# Patient Record
Sex: Female | Born: 1970 | Race: White | Hispanic: No | Smoking: Never smoker
Health system: Southern US, Community
[De-identification: ages and names within clinical notes are randomized; demographics above are authoritative.]

## PROBLEM LIST (undated history)

## (undated) DIAGNOSIS — R519 Headache, unspecified: Secondary | ICD-10-CM

## (undated) DIAGNOSIS — F419 Anxiety disorder, unspecified: Secondary | ICD-10-CM

## (undated) DIAGNOSIS — R51 Headache: Secondary | ICD-10-CM

## (undated) HISTORY — DX: Headache, unspecified: R51.9

## (undated) HISTORY — DX: Anxiety disorder, unspecified: F41.9

## (undated) HISTORY — PX: BUNIONECTOMY: SHX129

## (undated) HISTORY — DX: Headache: R51

## (undated) HISTORY — PX: ELBOW FRACTURE SURGERY: SHX616

## (undated) HISTORY — PX: WISDOM TOOTH EXTRACTION: SHX21

---

## 2008-12-22 LAB — CONVERTED CEMR LAB: Pap Smear: NORMAL

## 2009-03-06 ENCOUNTER — Ambulatory Visit: Payer: Self-pay | Admitting: Family Medicine

## 2009-03-06 DIAGNOSIS — J069 Acute upper respiratory infection, unspecified: Secondary | ICD-10-CM | POA: Insufficient documentation

## 2009-04-21 ENCOUNTER — Ambulatory Visit: Payer: Self-pay | Admitting: Family Medicine

## 2009-05-25 ENCOUNTER — Ambulatory Visit: Payer: Self-pay | Admitting: Family Medicine

## 2009-05-25 ENCOUNTER — Encounter: Admission: RE | Admit: 2009-05-25 | Discharge: 2009-05-25 | Payer: Self-pay | Admitting: Family Medicine

## 2009-05-25 DIAGNOSIS — M79609 Pain in unspecified limb: Secondary | ICD-10-CM

## 2009-05-25 DIAGNOSIS — R5381 Other malaise: Secondary | ICD-10-CM

## 2009-05-25 DIAGNOSIS — R5383 Other fatigue: Secondary | ICD-10-CM

## 2009-05-25 IMAGING — CR DG TIBIA/FIBULA 2V*L*
2 series · 2 of 2 positions shown · non-contrast
Comparison: None.

CLINICAL DATA: Left leg pain and tenderness.

LEFT TIBIA AND FIBULA - 2 VIEW

[view not recorded (1 of 2)]
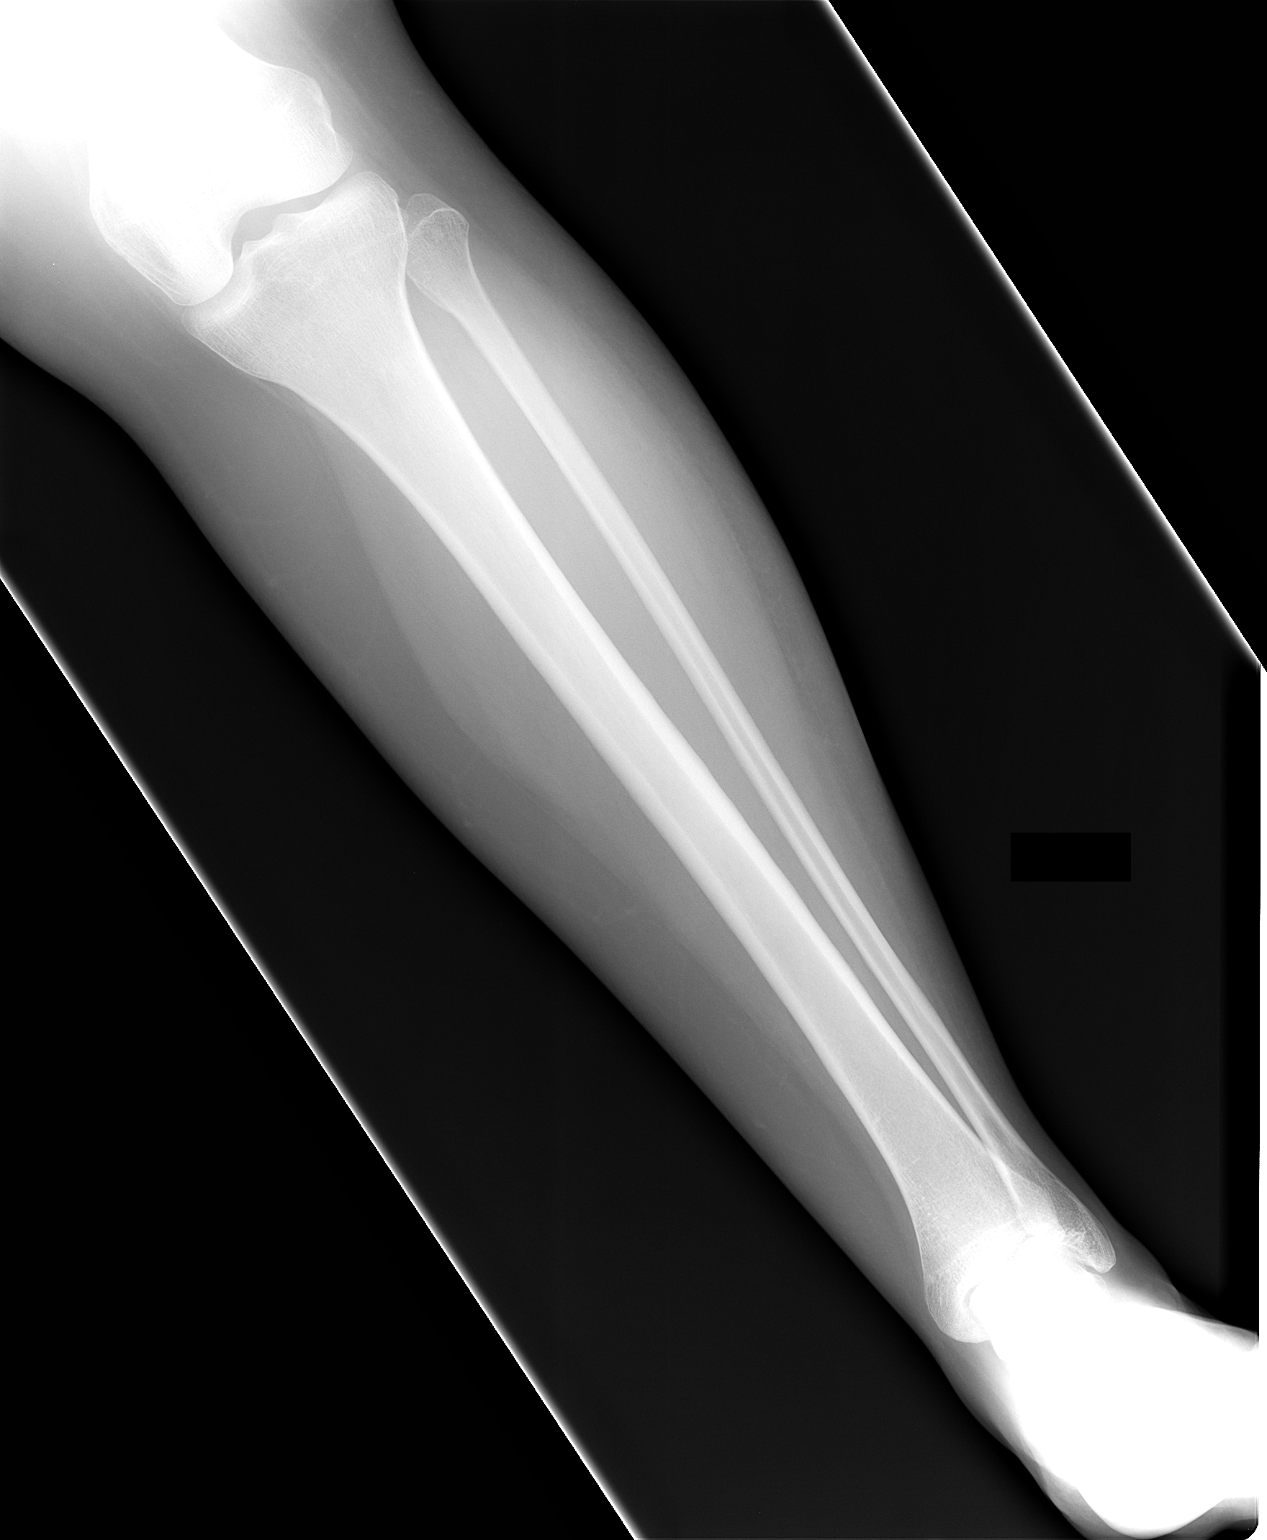

[view not recorded (2 of 2)]
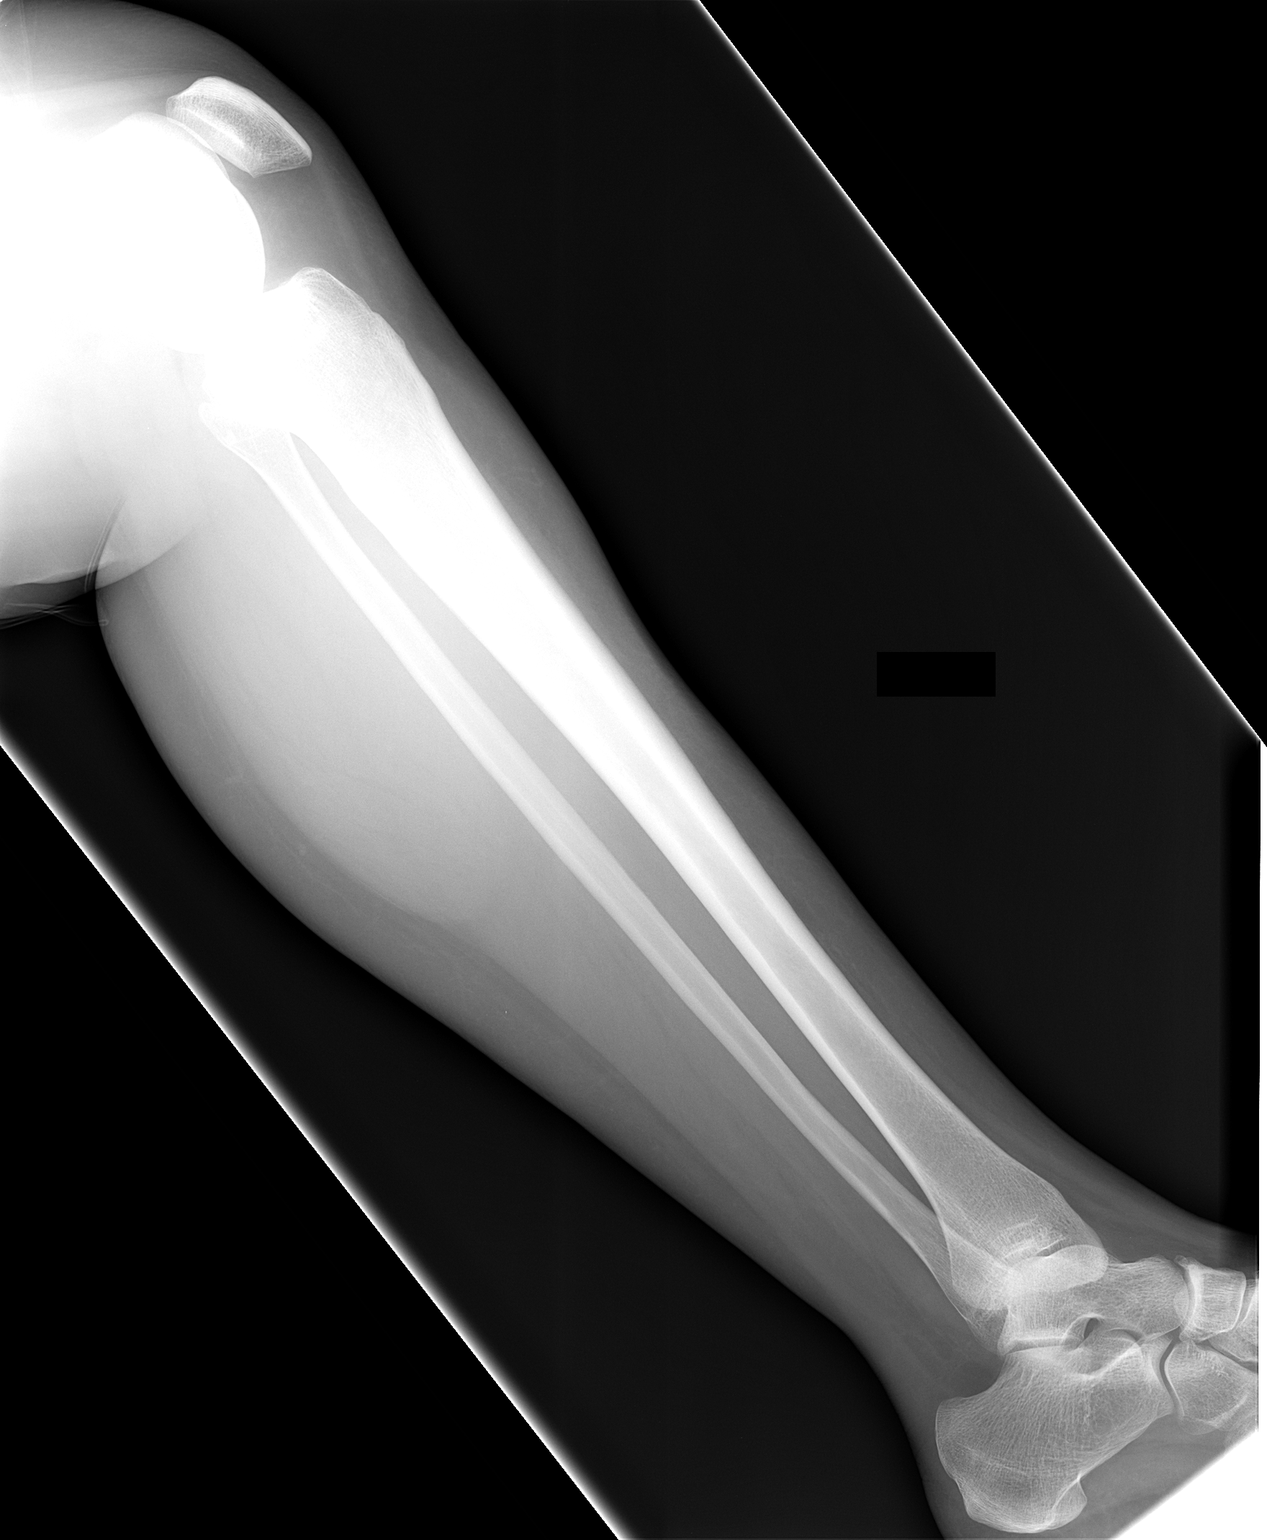

[2 of 2 positions shown; findings below may reference images not displayed]

FINDINGS: There is no evidence of fracture or other focal bone
lesions.  Soft tissues are unremarkable.
IMPRESSION: Negative.

## 2009-05-26 LAB — CONVERTED CEMR LAB
ALT: 10 units/L (ref 0–35)
AST: 12 units/L (ref 0–37)
Albumin: 4.2 g/dL (ref 3.5–5.2)
Alkaline Phosphatase: 63 units/L (ref 39–117)
BUN: 13 mg/dL (ref 6–23)
Calcium: 8.9 mg/dL (ref 8.4–10.5)
Cholesterol: 136 mg/dL (ref 0–200)
HDL: 67 mg/dL (ref >39)
Total CHOL/HDL Ratio: 2
Total Protein: 7.3 g/dL (ref 6.0–8.3)
Triglycerides: 81 mg/dL (ref <150)

## 2009-09-13 ENCOUNTER — Ambulatory Visit: Payer: Self-pay | Admitting: Family Medicine

## 2009-09-13 DIAGNOSIS — E559 Vitamin D deficiency, unspecified: Secondary | ICD-10-CM | POA: Insufficient documentation

## 2009-09-13 DIAGNOSIS — T148 Other injury of unspecified body region: Secondary | ICD-10-CM | POA: Insufficient documentation

## 2009-09-13 DIAGNOSIS — W57XXXA Bitten or stung by nonvenomous insect and other nonvenomous arthropods, initial encounter: Secondary | ICD-10-CM

## 2009-09-14 ENCOUNTER — Encounter: Payer: Self-pay | Admitting: Family Medicine

## 2010-05-23 NOTE — Assessment & Plan Note (Signed)
Summary: bug bites   Vital Signs:  Patient profile:   40 year old female Height:      62.2 inches Weight:      178 pounds BMI:     32.46 O2 Sat:      100 % on Room air Pulse rate:   70 / minute BP sitting:   123 / 79  (left arm) Cuff size:   regular  Vitals Entered By: Payton Spark CMA (Sep 13, 2009 3:23 PM)  O2 Flow:  Room air CC: Was in [place] over the weekend, got multiple insect bites on lower legs and lower legs/ankles swollen.    Primary Care Provider:  Nani Gasser MD  CC:  Was in [place] over the weekend and got multiple insect bites on lower legs and lower legs/ankles swollen. Rebecca Shea  History of Present Illness: 40 yo WF presents for bug bites on her legs first noticed on Sat night while at [place] (3 days ago).  She had ankle edema on Sunday.  She has been trying to keep feet elevated.  Using benadryl and epsom salt soaks.  Has a little voice hoarsenss and a dry cough today but no problems swallowing.    She is due to recheck her Vit D level today.  Has itching.  No fevers.  No pain other than from foot swelling.  Current Medications (verified): 1)  One-A-Day Extras Antioxidant  Caps (Multiple Vitamins-Minerals)  Allergies (verified): 1)  ! Sulfa  Past History:  Past Medical History: Reviewed history from 05/25/2009 and no changes required. Unremarkable Broken arm 6th grade.   Social History: Reviewed history from 05/25/2009 and no changes required. 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  Married to [place].  No kids. Non-smoker ETOH-yes No drugs Office work 2 caffeinated drinks per day.   Review of Systems      See HPI  Physical Exam  General:  alert, well-developed, well-nourished, and well-hydrated.   Head:  normocephalic and atraumatic.   Eyes:  conjunctiva clear Nose:  no nasal congestion Mouth:  o/p slightly injected Neck:  no masses.  no trouble swallowing Lungs:  Normal respiratory effort, chest expands symmetrically. Lungs  are clear to auscultation, no crackles or wheezes. Heart:  Normal rate and regular rhythm. S1 and S2 normal without gallop, murmur, click, rub or other extra sounds. Skin:  localized hyperemia around insect bites on LEs only.  No purulence.  1+ pitting soft tissue edema over the distal 1/3 of both LEs/ ankles and feet.   Cervical Nodes:  No lymphadenopathy noted Psych:  good eye contact, not anxious appearing, and not depressed appearing.     Impression & Recommendations:  Problem # 1:  INSECT BITE (ICD-919.4) Localized hypersensitivity reaction with soft tissue edema bilat LEs from recent insect bites w/o sign of infection. Treat with 5 days of Prednisone 40 mg/ day.  Use Benadryl at night.  Use cool compresses.  Wash w/ soap and water and cover with caladyrl to help dry out.  Elevate legs.  Call if not improved in 5 days.  Problem # 2:  UNSPECIFIED VITAMIN D DEFICIENCY (ICD-268.9) Rcheck level today. Orders: T-Vitamin D (25-Hydroxy) [id number])  Complete Medication List: 1)  One-a-day Extras Antioxidant Caps (Multiple vitamins-minerals) 2)  Prednisone 20 Mg Tabs (Prednisone) .... 2 tab by mouth q am x 5 days  Patient Instructions: 1)  Vitamin D level today. 2)  Will call you w/ results tomorrow. 3)  Start on Prednisone.  Take for 5 days. 4)  Use Tylenol PM if needed for sleep while on the Prednisone. 5)  Elevate legs, uses ice pack and topical Caladyrl cream.   6)  Call if leg swelling not improved in 5 days. Prescriptions: PREDNISONE 20 MG TABS (PREDNISONE) 2 tab by mouth q AM x 5 days  #10 x 0   Entered and Authorized by:   Seymour Bars DO   Signed by:   Seymour Bars DO on 09/13/2009   Method used:   Electronically to        [facility] [phone]* (retail)       [address]       [place], [place]  [address]       Ph: [phone] or [phone]       Fax: [phone]   RxID:   [id number]

## 2010-05-23 NOTE — Assessment & Plan Note (Signed)
Summary: NOV: URI, fatigue, knot on leg   Vital Signs:  Patient profile:   40 year old female Height:      62.2 inches Weight:      170 pounds BMI:     31.01 O2 Sat:      99 % Pulse rate:   73 / minute BP sitting:   110 / 70  (left arm) Cuff size:   regular  Vitals Entered By: Kathlene November (May 25, 2009 8:35 AM) CC: NP- get established Is Patient Diabetic? No   CC:  NP- get established.  History of Present Illness: iN Nov has a bad respiratory infection and strep. Was treated.  Around [place] Years had similar sxs adn was treated with ABX again. Has had 2 rounds of amoxicillin.  Last week started having sxs again.  Having some nasal drainage and congestion. Started some claritin about 3 days ago.  Still noticing some wheeze.  Feels fatigued.  NO ST this time.  No hx of asthma.  Used some nose spray, affirn last night.  Has noticed inc SOB with activity but is mild.    Also c/o tenderness in shins and forearms  and unable to get massages in that area since so sensitive. She also has s knot on the right out lower leg that has been there for almost a year. Initally was red but not not able to see if by looking at the skin.  It is tender to touch.    Habits & Providers  Alcohol-Tobacco-Diet     Alcohol drinks/day: <1     Tobacco Status: never  Exercise-Depression-Behavior     Does Patient Exercise: no     STD Risk: never     Drug Use: never     Seat Belt Use: always  Current Medications (verified): 1)  One-A-Day Extras Antioxidant  Caps (Multiple Vitamins-Minerals)  Allergies (verified): 1)  ! Sulfa  Comments:  Nurse/Medical Assistant: The patient's medications and allergies were reviewed with the patient and were updated in the Medication and Allergy Lists. Kathlene November (May 25, 2009 8:36 AM)  Past History:  Past Medical History: Unremarkable Broken arm 6th grade.   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  No kids. Non-smoker ETOH-yes No drugs Office work 2 caffeinated drinks per day. Smoking Status:  never Does Patient Exercise:  no STD Risk:  never Drug Use:  never Seat Belt Use:  always  Review of Systems       No fever/sweats/weakness, unexplained weight loss/gain.  No vison changes.  No difficulty hearing/ringing in ears, hay fever/allergies.  No chest pain/discomfort, palpitations.  No Br lump/nipple discharge.  + cough/wheeze.  No blood in BM, nausea/vomiting/diarrhea.  No nighttime urination, leaking urine, unusual vaginal bleeding, discharge (penis or vagina).  No muscle/joint pain. No rash, change in mole.  No HA, memory loss.  No anxiety, sleep d/o, depression.  No easy bruising/bleeding, + unexplained lump   Physical Exam  General:  Well-developed,well-nourished,in no acute distress; alert,appropriate and cooperative throughout examination Head:  Normocephalic and atraumatic without obvious abnormalities. No apparent alopecia or balding. Eyes:  No corneal or conjunctival inflammation noted. EOMI. Perrla. Ears:  External ear exam shows no significant lesions or deformities.  Otoscopic examination reveals clear canals, tympanic membranes are intact bilaterally without bulging, retraction,  inflammation or discharge. Hearing is grossly normal bilaterally. Nose:  External nasal examination shows no deformity or inflammation. Nasal mucosa are pink and moist without lesions or exudates. Mouth:  Tonsils are enlarged bilat with mild erythema.  No lesions.  Neck:  No deformities, masses, or tenderness noted. Lungs:  Normal respiratory effort, chest expands symmetrically. Lungs are clear to auscultation, no crackles or wheezes. Heart:  Normal rate and regular rhythm. S1 and S2 normal without gallop, murmur, click, rub or other extra sounds. Msk:   Right lower leg abourt 4 inches above her lateral malleolus has a slightly firm noduel that is tender but no skin changes.  Extremities:  NO LE edmea.  Skin:  no rashes.   Cervical Nodes:  no anterior cervical adenopathy.   Psych:  Cognition and judgment appear intact. Alert and cooperative with normal attention span and concentration. No apparent delusions, illusions, hallucinations   Impression & Recommendations:  Problem # 1:  URI (ICD-465.9) Discussed that it is difficulty to say that these may be 3 totally separate infections since did have a month betweeen each one. She is having excessive fatigue wtih this one so will check for mono. Tonsils are enlarged but she doesn't really have much of a ST. Mono was neg. Likely viral. If not better in 1 week then please call and will treat.   The following medications were removed from the medication list:    Mucinex D 60-600 Mg Xr12h-tab (Pseudoephedrine-guaifenesin) .Marland Kitchen... As directed  Problem # 2:  FATIGUE (ICD-780.79) Will also rule out thyroid d/o and vitamin D def since has alot of tenderness in her shins and forearms.  Orders: T-Vitamin D (25-Hydroxy) [phone]) T-TSH [phone]) T-Comprehensive Metabolic Panel ([phone])  Problem # 3:  SCREENING FOR LIPOID DISORDERS (ICD-V77.91) She would also liek to have a cholesteorl screen. She is fasting today.  Orders: T-Lipid Profile ([id number])  Problem # 4:  LEG PAIN, RIGHT (ICD-729.5)  Will get xray of right lower  tibia and fibula to rule out lesion on teh bone or calcified traumatic lesion.   Orders: T-*Unlisted Diagnostic X-ray test/procedure [id number])  Complete Medication List: 1)  One-a-day Extras Antioxidant Caps (Multiple vitamins-minerals)  TD Result Date:  04/23/2004 TD Result:  given TD Next Due:  10 yr PAP Result Date:  12/22/2008 PAP Result:  normal

## 2011-10-30 ENCOUNTER — Encounter: Payer: Self-pay | Admitting: Physician Assistant

## 2011-10-30 ENCOUNTER — Ambulatory Visit (INDEPENDENT_AMBULATORY_CARE_PROVIDER_SITE_OTHER): Payer: BC Managed Care – PPO

## 2011-10-30 ENCOUNTER — Ambulatory Visit (INDEPENDENT_AMBULATORY_CARE_PROVIDER_SITE_OTHER): Payer: BC Managed Care – PPO | Admitting: Physician Assistant

## 2011-10-30 ENCOUNTER — Ambulatory Visit: Payer: Self-pay | Admitting: Physician Assistant

## 2011-10-30 VITALS — BP 111/72 | HR 69 | Temp 98.3°F | Ht 62.0 in | Wt 162.0 lb

## 2011-10-30 DIAGNOSIS — S299XXA Unspecified injury of thorax, initial encounter: Secondary | ICD-10-CM

## 2011-10-30 DIAGNOSIS — F411 Generalized anxiety disorder: Secondary | ICD-10-CM

## 2011-10-30 DIAGNOSIS — S298XXA Other specified injuries of thorax, initial encounter: Secondary | ICD-10-CM

## 2011-10-30 DIAGNOSIS — Z0000 Encounter for general adult medical examination without abnormal findings: Secondary | ICD-10-CM

## 2011-10-30 DIAGNOSIS — X58XXXA Exposure to other specified factors, initial encounter: Secondary | ICD-10-CM

## 2011-10-30 DIAGNOSIS — R0789 Other chest pain: Secondary | ICD-10-CM

## 2011-10-30 DIAGNOSIS — E559 Vitamin D deficiency, unspecified: Secondary | ICD-10-CM

## 2011-10-30 DIAGNOSIS — Z131 Encounter for screening for diabetes mellitus: Secondary | ICD-10-CM

## 2011-10-30 DIAGNOSIS — R079 Chest pain, unspecified: Secondary | ICD-10-CM

## 2011-10-30 DIAGNOSIS — F419 Anxiety disorder, unspecified: Secondary | ICD-10-CM

## 2011-10-30 DIAGNOSIS — F329 Major depressive disorder, single episode, unspecified: Secondary | ICD-10-CM

## 2011-10-30 DIAGNOSIS — Z13220 Encounter for screening for lipoid disorders: Secondary | ICD-10-CM

## 2011-10-30 IMAGING — CR DG CHEST 2V
2 series · 2 of 2 positions shown · non-contrast
Comparison: None.

CLINICAL DATA: Chest trauma 10 days ago, persistent pain

CHEST - 2 VIEW

[view not recorded (1 of 2)]
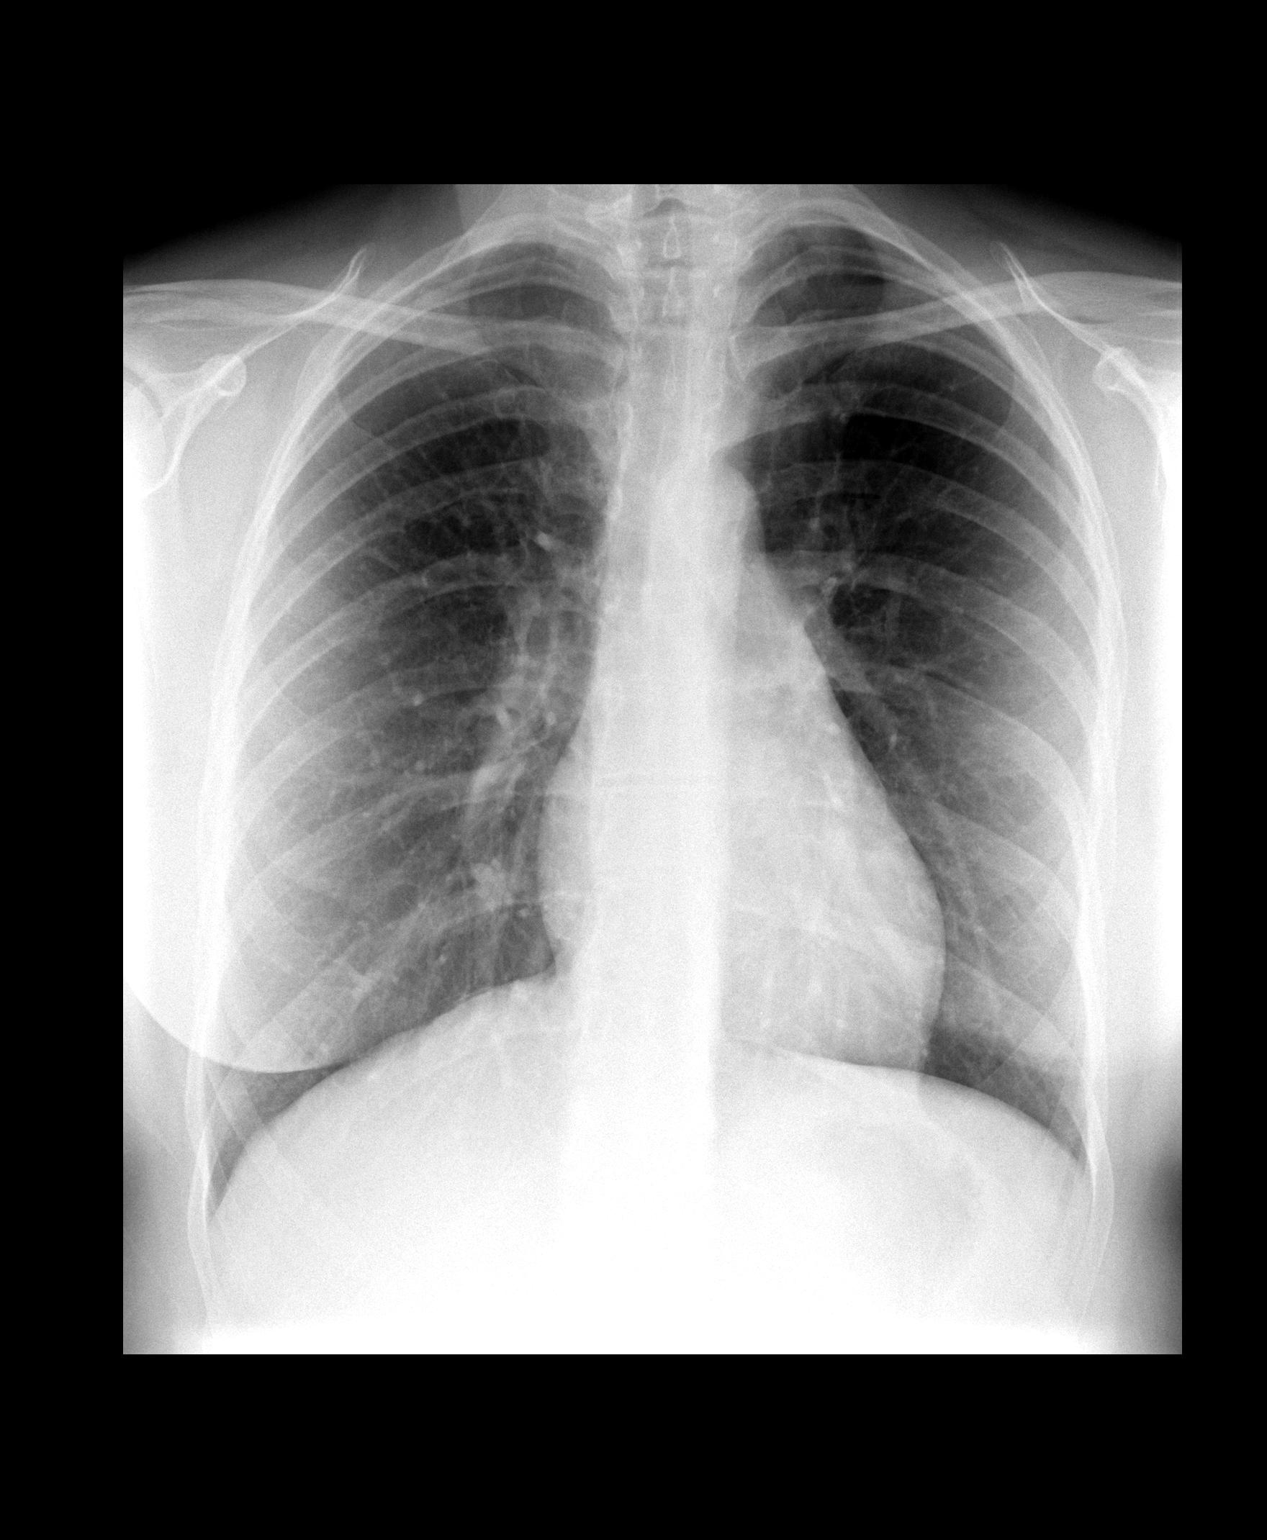

[view not recorded (2 of 2)]
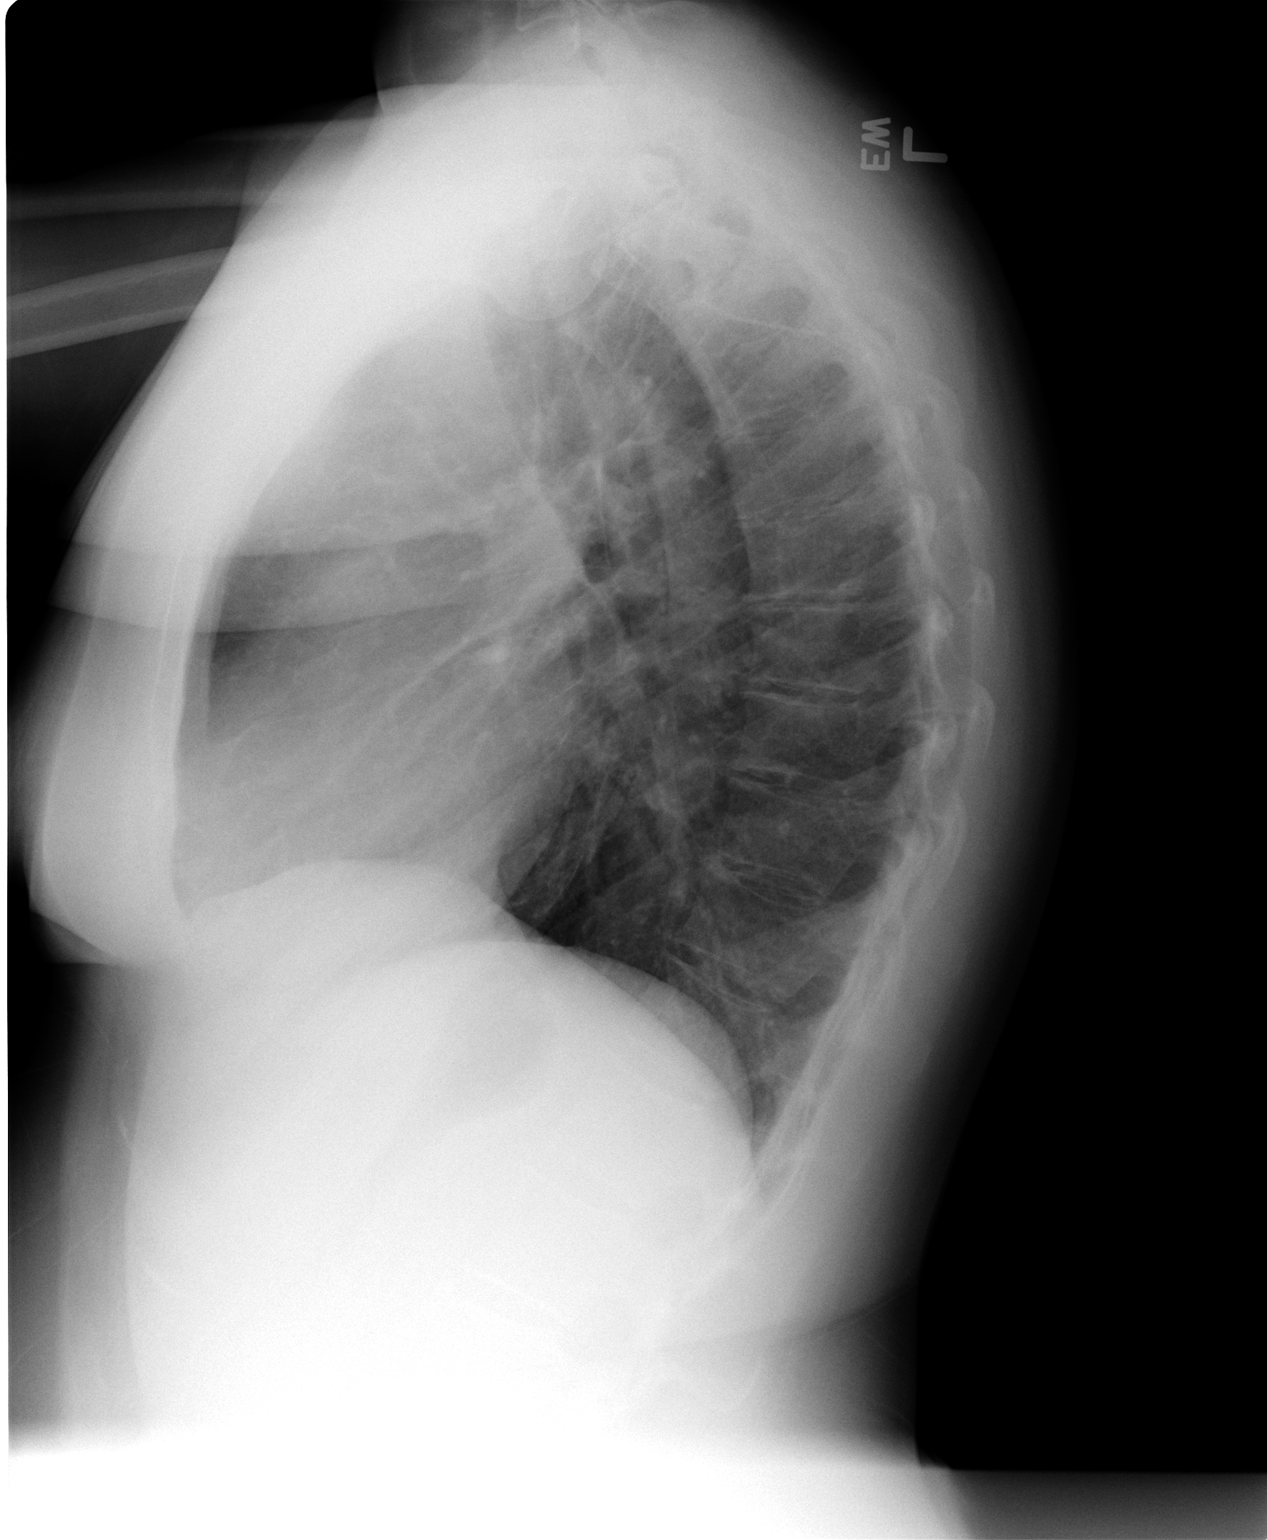

[2 of 2 positions shown; findings below may reference images not displayed]

FINDINGS: The lungs are clear.  No pneumothorax is seen.
Mediastinal contours appear normal.  The heart is within normal
limits in size.  No bony abnormality is seen.
IMPRESSION: No active lung disease.

## 2011-10-30 MED ORDER — CLONAZEPAM 0.5 MG PO TABS: 0.5000 mg | ORAL_TABLET | Freq: Two times a day (BID) | ORAL | Status: DC | PRN

## 2011-10-30 MED ORDER — SERTRALINE HCL 50 MG PO TABS: ORAL_TABLET | ORAL | Status: DC

## 2011-10-30 NOTE — Progress Notes (Addendum)
  Subjective:    Patient ID: Rebecca Shea, female    DOB: 12-02-1970, 41 y.o.   MRN: [id number]  HPI 10 days ago a friend tried to pop her back in place and since her ribs have hurt and she has had chest tightness. Pain is worse when she takes a deep breath in. She did also go ziplining on Sunday. She denies and chest pain but there is a lot of tightness and strain. Denies any numbness or tingling down arms, jaw pain, or radiating pain.  Walking also makes worse. NO SOB or palpations. No swelling. She also talks about feeling very overwhelmed and anxious. Her marriage is not doing well and then she feels more tired than usual. She has some bouts of feeling down but more just worried about the furture. She is trying to make time to talk to someone to see if she can work on her marriage. She denies any thoughts of suicide. Not ever tried anything for depression or anxiety.     Review of Systems     Objective:   Physical Exam  Constitutional: She is oriented to person, place, and time. She appears well-developed and well-nourished.  HENT:  Head: Normocephalic and atraumatic.  Eyes: Conjunctivae are normal.  Neck: Normal range of motion. Neck supple. No thyromegaly present.  Cardiovascular: Normal rate, regular rhythm and normal heart sounds.   Pulmonary/Chest: Effort normal and breath sounds normal. She has no wheezes.  Musculoskeletal:       Tenderness with palpation over anterior bottom left and right ribs.   Neurological: She is alert and oriented to person, place, and time.  Skin: Skin is warm and dry.  Psychiatric: Her behavior is normal.       Seemed overwhelmed and cried talking about her marriage.           Assessment & Plan:  Chest trauma/rib pain- EKG- NSR, No acute ST changes, NOrmal axis.Will get chest xray and call with results. For soreness and pain take Ibuprofen 800mg  up to three times a day. If not continuing to improve call office.   Anxiety-  Start Zoloft today 1/2  tab for 7 days then increase to 1 tab. Only take klonapin as needed. Discussed side effects of both medications. If depression worsens please call office and stop medication.   . Schedule CPE and f/u for same visit in 4-6 weeks. Gave lab slip to get bloodwork.

## 2011-10-30 NOTE — Patient Instructions (Addendum)
Will call with chest x-ray. Will call with lab results. Schedule CPE and f/u for same visit in 4-6 weeks. Start Zoloft today 1/2 tab for 7 days then increase to 1 tab. Only take klonapin as needed.   Take ibuprofen 800mg  up to three times a day for left rib pain.

## 2011-10-31 LAB — LIPID PANEL
Cholesterol: 154 mg/dL (ref 0–200)
HDL: 77 mg/dL (ref >39)
LDL Cholesterol: 55 mg/dL (ref 0–99)
Triglycerides: 112 mg/dL (ref <150)
VLDL: 22 mg/dL (ref 0–40)

## 2011-10-31 LAB — COMPREHENSIVE METABOLIC PANEL
ALT: 9 U/L (ref 0–35)
Albumin: 4.2 g/dL (ref 3.5–5.2)
Calcium: 9.2 mg/dL (ref 8.4–10.5)
Total Protein: 6.8 g/dL (ref 6.0–8.3)

## 2011-10-31 LAB — VITAMIN D 25 HYDROXY (VIT D DEFICIENCY, FRACTURES): Vit D, 25-Hydroxy: 31 ng/mL (ref 30–89)

## 2011-11-06 ENCOUNTER — Encounter: Payer: Self-pay | Admitting: *Deleted

## 2011-12-26 ENCOUNTER — Encounter: Payer: Self-pay | Admitting: Physician Assistant

## 2011-12-26 ENCOUNTER — Ambulatory Visit (INDEPENDENT_AMBULATORY_CARE_PROVIDER_SITE_OTHER): Payer: BC Managed Care – PPO | Admitting: Physician Assistant

## 2011-12-26 VITALS — BP 108/76 | HR 75 | Ht 62.0 in | Wt 159.0 lb

## 2011-12-26 DIAGNOSIS — F411 Generalized anxiety disorder: Secondary | ICD-10-CM

## 2011-12-26 DIAGNOSIS — E538 Deficiency of other specified B group vitamins: Secondary | ICD-10-CM | POA: Insufficient documentation

## 2011-12-26 DIAGNOSIS — F329 Major depressive disorder, single episode, unspecified: Secondary | ICD-10-CM

## 2011-12-26 DIAGNOSIS — F419 Anxiety disorder, unspecified: Secondary | ICD-10-CM

## 2011-12-26 DIAGNOSIS — L709 Acne, unspecified: Secondary | ICD-10-CM

## 2011-12-26 DIAGNOSIS — L708 Other acne: Secondary | ICD-10-CM

## 2011-12-26 HISTORY — DX: Anxiety disorder, unspecified: F41.9

## 2011-12-26 MED ORDER — SERTRALINE HCL 50 MG PO TABS: 50.0000 mg | ORAL_TABLET | Freq: Every day | ORAL | Status: DC

## 2011-12-26 NOTE — Progress Notes (Signed)
  Subjective:    Patient ID: Rebecca Shea, female    DOB: [birth year], 41 y.o.   MRN: [id number]  HPI Patient presents to the clinic today to followup on depression and starting Zoloft. She is doing much better on Zoloft. She feels great and feels like the depression and anxiety have decreased significantly. Her husband even noticed a difference and has helped her relationship. They're also in counseling in between counseling and medication there to a much better. She denies any side effects of Zoloft and reports that is actually giving her more energy to start exercising.  Her rib pain present at last visit has resolved with anti-inflammatories.  She does report some new acne on the sides of her mouth and on her lower neck for last 2 weeks. She does not watch her face regularly. She has not tried anything to make better and nothing seems to make worse. She has been wearing a different makeup recently and eating a lot of acidic foods.   Review of Systems     Objective:   Physical Exam  Constitutional: She is oriented to person, place, and time. She appears well-developed and well-nourished.  HENT:  Head: Normocephalic and atraumatic.  Cardiovascular: Normal rate, regular rhythm, normal heart sounds and intact distal pulses.   Pulmonary/Chest: Effort normal and breath sounds normal.  Neurological: She is alert and oriented to person, place, and time.  Skin:       Tiny white bumps at the corner of left lower lip with erythematous base. Red papules on the lower left side of neck with surrounding erythema. No pustules or cysts present.  Psychiatric: She has a normal mood and affect. Her behavior is normal.          Assessment & Plan:  Anxiety/depression- GAD-7 was 4 and PHQ-9 was 2. Refilled Zoloft same dose of 50 mg daily. Continue going to counseling. Followup in 3 months.  Acne- discuss with patient the need to wash her face at night and use a mild moisturizer. Also encouraged her to  limit her acidic foods since this might be causing the irritation around the mouth. If not improved in the next week or so call office and we can give her a nightly cream to use on affected areas. Discussed with the patient about makeup and how heavy her makeup skin calls more problems with acne. She may consider trying other makeup (see if this helps.  Patient reminded that she does need a Pap smear she reports that she has scheduled one for October.

## 2011-12-26 NOTE — Patient Instructions (Signed)
Zoloft refilled. Continue with counseling. Call if acne not improving with washing face regularly.  Acne Acne is a skin problem that causes pimples. Acne occurs when the pores in your skin get blocked. Your pores may become red, sore, and swollen (inflamed), or infected with a common skin bacterium (Propionibacterium acnes). Acne is a common skin problem. Up to 80% of people get acne at some time. Acne is especially common from the ages of 52 to 30. Acne usually goes away over time with proper treatment. CAUSES  Your pores each contain an oil gland. The oil glands make an oily substance called sebum. Acne happens when these glands get plugged with sebum, dead skin cells, and dirt. The P. acnes bacteria that are normally found in the oil glands then multiply, causing inflammation. Acne is commonly triggered by changes in your hormones. These hormonal changes can cause the oil glands to get bigger and to make more sebum. Factors that can make acne worse include:  Hormone changes during adolescence.   Hormone changes during women's menstrual cycles.   Hormone changes during pregnancy.   Oil-based cosmetics and hair products.   Harshly scrubbing the skin.   Strong soaps.   Stress.   Hormone problems due to certain diseases.   Long or oily hair rubbing against the skin.   Certain medicines.   Pressure from headbands, backpacks, or shoulder pads.   Exposure to certain oils and chemicals.  SYMPTOMS  Acne often occurs on the face, neck, chest, and upper back. Symptoms include:  Small, red bumps (pimples or papules).   Whiteheads (closed comedones).   Blackheads (open comedones).   Small, pus-filled pimples (pustules).   Big, red pimples or pustules that feel tender.  More severe acne can cause:  An infected area that contains a collection of pus (abscess).   Hard, painful, fluid-filled sacs (cysts).   Scars.  DIAGNOSIS  Your caregiver can usually tell what the problem is by  doing a physical exam. TREATMENT  There are many good treatments for acne. Some are available over-the-counter and some are available with a prescription. The treatment that is best for you depends on the type of acne you have and how severe it is. It may take 2 months of treatment before your acne gets better. Common treatments include:  Creams and lotions that prevent oil glands from clogging.   Creams and lotions that treat or prevent infections and inflammation.   Antibiotics applied to the skin or taken as a pill.   Pills that decrease sebum production.   Birth control pills.   Light or laser treatments.   Minor surgery.   Injections of medicine into the affected areas.   Chemicals that cause peeling of the skin.  HOME CARE INSTRUCTIONS  Good skin care is the most important part of treatment.  Wash your skin gently at least twice a day and after exercise. Always wash your skin before bed.   Use mild soap.   After each wash, apply a water-based skin moisturizer.   Keep your hair clean and off of your face. Shampoo your hair daily.   Only take medicines as directed by your caregiver.   Use a sunscreen or sunblock with SPF 30 or greater. This is especially important when you are using acne medicines.   Choose cosmetics that are noncomedogenic. This means they do not plug the oil glands.   Avoid leaning your chin or forehead on your hands.   Avoid wearing tight headbands or hats.  Avoid picking or squeezing your pimples. This can make your acne worse and cause scarring.  SEEK MEDICAL CARE IF:   Your acne is not better after 8 weeks.   Your acne gets worse.   You have a large area of skin that is red or tender.  Document Released: 04/06/2000 Document Revised: 03/29/2011 Document Reviewed: 01/26/2011 [facility] Patient Information 2012 [place], [place].

## 2012-03-26 ENCOUNTER — Ambulatory Visit (INDEPENDENT_AMBULATORY_CARE_PROVIDER_SITE_OTHER): Payer: BC Managed Care – PPO | Admitting: Family Medicine

## 2012-03-26 ENCOUNTER — Encounter: Payer: Self-pay | Admitting: Family Medicine

## 2012-03-26 VITALS — BP 110/65 | HR 71 | Ht 61.0 in | Wt 161.0 lb

## 2012-03-26 DIAGNOSIS — F411 Generalized anxiety disorder: Secondary | ICD-10-CM

## 2012-03-26 DIAGNOSIS — L708 Other acne: Secondary | ICD-10-CM

## 2012-03-26 DIAGNOSIS — L709 Acne, unspecified: Secondary | ICD-10-CM

## 2012-03-26 DIAGNOSIS — L259 Unspecified contact dermatitis, unspecified cause: Secondary | ICD-10-CM

## 2012-03-26 DIAGNOSIS — L309 Dermatitis, unspecified: Secondary | ICD-10-CM

## 2012-03-26 MED ORDER — TRIAMCINOLONE ACETONIDE 0.1 % EX CREA: TOPICAL_CREAM | Freq: Every day | CUTANEOUS | Status: DC | PRN

## 2012-03-26 MED ORDER — CLINDAMYCIN PHOSPHATE 1 % EX LOTN: TOPICAL_LOTION | Freq: Two times a day (BID) | CUTANEOUS | Status: DC

## 2012-03-26 NOTE — Progress Notes (Signed)
  Subjective:    Patient ID: Rebecca Shea, female    DOB: [birth year], 41 y.o.   MRN: [id number]  HPI Anixety - happy with her current regimen of sertraline 50mg .  Has been on it for 6 months.  Sleeping well.m Mood has been good.  She did have a stressful week and a she spent the holiday with her in-laws.  Acne - Over last year or so has been getting acne on her facial cheeks and chin. Says not sure why. Never really had problems with acne before. Has been using cleansers and moisturizers without improvement.   Dry patch near her left lateral corner of her eye.  She dry, red, no painful it itchy.  Says it comes it looks better and sometimes it looks worse. She's not been trying anything on it Except for moisturizers.  Review of Systems     Objective:   Physical Exam  Constitutional: She appears well-developed and well-nourished.  Skin: Skin is warm and dry.       Fine papular acne on her cheeks and on her chin. No nodular or cystic lesions. No lesions on her forehead. She does have a small irregular erythematous dry patch on the corner of the left eye.  Psychiatric: She has a normal mood and affect. Her behavior is normal.          Assessment & Plan:  Anxiety - GAD7 score of 5.  Continue current regimen. Followup in 6 months. She's happy with the dose of 50 mg we will keep this for now.  Acne - discussed that he should be a good candidate for topical antibiotic treatment such as clindamycin. This usually works really well in conjunction with benzoyl peroxide. I encouraged her to try to find over-the-counter benzoyl peroxide face wash. If she's not able to then we might even do a combination product with a topical clindamycin for sometimes benzoyl peroxide can be very drying and she says she are he struggles with dry skin. If it's not helping over the next month or 2 then please let me know and we can change regimens.  Eczema - will treat with topical steroid cream. Discussed the  importance of avoiding getting in the eye and using a very small amount. If it's not improving over the next 2 weeks and please let me know.

## 2012-04-27 ENCOUNTER — Other Ambulatory Visit: Payer: Self-pay | Admitting: Physician Assistant

## 2012-08-25 ENCOUNTER — Other Ambulatory Visit: Payer: Self-pay | Admitting: *Deleted

## 2012-08-25 MED ORDER — SERTRALINE HCL 50 MG PO TABS: ORAL_TABLET | ORAL | Status: DC

## 2012-09-09 ENCOUNTER — Encounter: Payer: Self-pay | Admitting: Family Medicine

## 2012-09-09 ENCOUNTER — Ambulatory Visit (INDEPENDENT_AMBULATORY_CARE_PROVIDER_SITE_OTHER): Payer: BC Managed Care – PPO | Admitting: Family Medicine

## 2012-09-09 VITALS — BP 103/67 | HR 73 | Wt 176.0 lb

## 2012-09-09 DIAGNOSIS — R238 Other skin changes: Secondary | ICD-10-CM

## 2012-09-09 DIAGNOSIS — B029 Zoster without complications: Secondary | ICD-10-CM

## 2012-09-09 DIAGNOSIS — R195 Other fecal abnormalities: Secondary | ICD-10-CM

## 2012-09-09 LAB — PROTIME-INR
INR: 0.9 (ref <1.50)
Prothrombin Time: 12.2 seconds (ref 11.6–15.2)

## 2012-09-09 LAB — CBC WITH DIFFERENTIAL/PLATELET
Basophils Relative: 1 % (ref 0–1)
Eosinophils Relative: 4 % (ref 0–5)
Lymphocytes Relative: 33 % (ref 12–46)
Lymphs Abs: 2 10*3/uL (ref 0.7–4.0)
MCHC: 33.6 g/dL (ref 30.0–36.0)
MCV: 80.7 fL (ref 78.0–100.0)
Monocytes Absolute: 0.6 10*3/uL (ref 0.1–1.0)
Monocytes Relative: 9 % (ref 3–12)
Platelets: 235 10*3/uL (ref 150–400)
WBC: 6 10*3/uL (ref 4.0–10.5)

## 2012-09-09 MED ORDER — AMITRIPTYLINE HCL 25 MG PO TABS: 25.0000 mg | ORAL_TABLET | Freq: Every day | ORAL | Status: DC

## 2012-09-09 MED ORDER — VALACYCLOVIR HCL 1 G PO TABS: 1000.0000 mg | ORAL_TABLET | Freq: Three times a day (TID) | ORAL | Status: DC

## 2012-09-09 MED ORDER — LIDOCAINE 5 % EX OINT: TOPICAL_OINTMENT | CUTANEOUS | Status: DC | PRN

## 2012-09-09 NOTE — Progress Notes (Signed)
CC: Rebecca Shea is a 42 y.o. female is here for concerned about shingles and bruising on the legs   Subjective: HPI:  Patient complains of rash on her right flank and abdomen. This occurred Sunday night was preceded by itching and rash erupted and is now painful to touch. Pain is described as a stinging mild to moderate in severity nonradiating. Worse with touch improves at rest without touching. Has never had this before, no interventions as of yet.  Denies fevers, chills, nausea, vomiting, nor skin changes elsewhere.  Patient complains of bruising on both shins that have been present since February. They were noticed one day after running into furniture. They have slowly improved since onset however she's concerned with slow resolution. Skin is tender to the touch. She denies bruising abnormality or bleeding issues recently or remotely other than this episode. Denies history of blood clots or clotting disorder. Denies bruising elsewhere on the body. Denies shortness of breath, fatigue, swollen lymph nodes  Patient complains of loose stools for the past 2 weeks. Described as mild soft stools defecating 3 times a day typically after meals. Usually only defecates once a day. Nothing else makes better or worse no interventions as of yet. Associated with mild low abdominal pain improves after defecation. Denies genitourinary complaints, constipation, blood in stool, or like stool, mucousy stool, recent travel, recent antibiotic use nor family history of inflammatory bowel disease. Denies unintentional weight loss, denies watery stool    Review Of Systems Outlined In HPI  Past Medical History  Diagnosis Date  . Anxiety 12/26/2011     History reviewed. No pertinent family history.   History  Substance Use Topics  . Smoking status: Never Smoker   . Smokeless tobacco: Not on file  . Alcohol Use: Not on file     Objective: Filed Vitals:   09/09/12 0844  BP: 103/67  Pulse: 73    General:  Alert and Oriented, No Acute Distress HEENT: Pupils equal, round, reactive to light. Conjunctivae clear.  Moist mucous membranes Lungs: Clear to auscultation bilaterally, no wheezing/ronchi/rales.  Comfortable work of breathing. Good air movement. Cardiac: Regular rate and rhythm. Normal S1/S2.  No murmurs, rubs, nor gallops.   Abdomen: Obese soft without guarding nor rebound. Negative Murphy's. No palpable masses. Mild right and left lower crotch of pain to deep palpation. Extremities: No peripheral edema.  Strong peripheral pulses. Trace ecchymosis on the bilateral shins without bruising or petechiae elsewhere Mental Status: No depression, anxiety, nor agitation. Skin: Warm and dry. Grouped vesicular lesions in a dermatomal pattern approximately T11 on the right side with erythematous base tender to the touch  Assessment & Plan: Rebecca Shea was seen today for concerned about shingles and bruising on the legs.  Diagnoses and associated orders for this visit:  Shingles - valACYclovir (VALTREX) 1000 MG tablet; Take 1 tablet (1,000 mg total) by mouth 3 (three) times daily. - amitriptyline (ELAVIL) 25 MG tablet; Take 1 tablet (25 mg total) by mouth at bedtime. - lidocaine (XYLOCAINE) 5 % ointment; Apply topically as needed.  Abnormal bruising - INR/PT - CBC with Differential  Loose stools    Shingles: Discussed presentation and history are suspicious for shingles although she is young. Will start valacyclovir to prevent progression and can consider using lidocaine and amitriptyline to control pain. Abnormal bruising: Provided reassurance that it may take up to months for bruising on the shins to completely resolve, will rule out thrombocytopenia or coagulation disorder with the above labs Loose stools: Provided  reassurance low suspicion for infectious etiology, symptoms may be beginning of IBS therefore start psyllium one heaping teaspoon daily to help with regularity  Return in about 4 weeks  (around 10/07/2012).

## 2012-09-11 ENCOUNTER — Telehealth: Payer: Self-pay | Admitting: *Deleted

## 2012-09-11 DIAGNOSIS — B029 Zoster without complications: Secondary | ICD-10-CM

## 2012-09-11 MED ORDER — ACYCLOVIR 800 MG PO TABS: 800.0000 mg | ORAL_TABLET | Freq: Every day | ORAL | Status: DC

## 2012-09-11 NOTE — Telephone Encounter (Signed)
Amitriptyline was to prevent the nerve pain from shingles, both valacyclovir and amitriptyline can cause some of the symptoms she's describing, I'd recommend she stop the amitriptyline first and if the body aches do not improve by Saturday morning then stop valacyclovir as well especially if the rash is not enlarging.

## 2012-09-11 NOTE — Telephone Encounter (Signed)
Informed patient of this information.  She states that she hasn't even started the amitriptyline yet.

## 2012-09-11 NOTE — Telephone Encounter (Signed)
This sounds much more likely a side effect of valacyclovir then, unfortunately the other option of antivirals has the same side effect profile and has to be taken five times a day.  The name of it is acyclovir, unfortunately I can't speculate on the probability of whether or not this option would cause similar effects but I would say it's at least likely. If the blisters are starting to crust then I would say don't take valcyclovir or the acyclovir option, otherwise I'll make the option of switching to acyclovir available by sending it to [facility] on [address].

## 2012-09-11 NOTE — Telephone Encounter (Signed)
Pt calls & states that she is now experiencing generalized all over body pain, headache with light sensitivity.  Pain scale is 7 out of 10.  Is this normal? And is the amiltriptilyne for the generalized body aches? She states that she is not experiencing any pain at the actual sites.  Please advise.

## 2012-09-11 NOTE — Telephone Encounter (Signed)
Pt notified & is going to try the acyclovir.  She states that the places she has on her have never blistered.  She has taken some ibuprofen & states that it seems to be giving her some relief.

## 2012-09-24 ENCOUNTER — Ambulatory Visit: Payer: BC Managed Care – PPO | Admitting: Family Medicine

## 2012-09-30 ENCOUNTER — Ambulatory Visit (INDEPENDENT_AMBULATORY_CARE_PROVIDER_SITE_OTHER): Payer: BC Managed Care – PPO | Admitting: Family Medicine

## 2012-09-30 ENCOUNTER — Encounter: Payer: Self-pay | Admitting: Family Medicine

## 2012-09-30 VITALS — BP 125/77 | HR 67 | Wt 180.0 lb

## 2012-09-30 DIAGNOSIS — E559 Vitamin D deficiency, unspecified: Secondary | ICD-10-CM

## 2012-09-30 DIAGNOSIS — Z0000 Encounter for general adult medical examination without abnormal findings: Secondary | ICD-10-CM

## 2012-09-30 DIAGNOSIS — E538 Deficiency of other specified B group vitamins: Secondary | ICD-10-CM

## 2012-09-30 MED ORDER — SERTRALINE HCL 50 MG PO TABS: ORAL_TABLET | ORAL | Status: DC

## 2012-09-30 NOTE — Patient Instructions (Addendum)
Keep up a regular exercise program and make sure you are eating a healthy diet Try to eat 4 servings of dairy a day, or if you are lactose intolerant take a calcium with vitamin D daily.  Your vaccines are up to date.   

## 2012-09-30 NOTE — Progress Notes (Signed)
Subjective:     Rebecca Shea is a 42 y.o. female and is here for a comprehensive physical exam. The patient reports problems - bruise on anterior legs.  History   Social History  . Marital Status: Married    Spouse Name: N/A    Number of Children: N/A  . Years of Education: N/A   Occupational History  . Not on file.   Social History Main Topics  . Smoking status: Never Smoker   . Smokeless tobacco: Not on file  . Alcohol Use: Not on file  . Drug Use: Not on file  . Sexually Active: Not on file   Other Topics Concern  . Not on file   Social History Narrative  . No narrative on file   Health Maintenance  Topic Date Due  . Influenza Vaccine  12/22/2012  . Tetanus/tdap  04/23/2014  . Pap Smear  12/23/2014    The following portions of the patient's history were reviewed and updated as appropriate: allergies, current medications, past family history, past medical history, past social history, past surgical history and problem list.  Review of Systems A comprehensive review of systems was negative.   Objective:    BP 125/77  Pulse 67  Wt 180 lb (81.647 kg)  BMI 34.03 kg/m2 General appearance: alert, cooperative and appears stated age Head: Normocephalic, without obvious abnormality, atraumatic Eyes: conj clear, EOMi, PEERLA, weraing contacts Ears: normal TM's and external ear canals both ears Nose: Nares normal. Septum midline. Mucosa normal. No drainage or sinus tenderness. Throat: lips, mucosa, and tongue normal; teeth and gums normal Neck: no adenopathy, no carotid bruit, no JVD, supple, symmetrical, trachea midline and thyroid not enlarged, symmetric, no tenderness/mass/nodules Back: symmetric, no curvature. ROM normal. No CVA tenderness. Lungs: clear to auscultation bilaterally Heart: regular rate and rhythm, S1, S2 normal, no murmur, click, rub or gallop Abdomen: soft, non-tender; bowel sounds normal; no masses,  no organomegaly Extremities: extremities  normal, atraumatic, no cyanosis or edema Pulses: 2+ and symmetric Skin: Skin color, texture, turgor normal. No rashes or lesions Lymph nodes: Cervical, supraclavicular, and axillary nodes normal. Neurologic: Alert and oriented X 3, normal strength and tone. Normal symmetric reflexes. Normal coordination and gait    Assessment:    Healthy female exam.    Plan:     See After Visit Summary for Counseling Recommendations   Keep up a regular exercise program and make sure you are eating a healthy diet Try to eat 4 servings of dairy a day, or if you are lactose intolerant take a calcium with vitamin D daily.  Your vaccines are up to date.   We'll get a tickle work as well. She also has a history of B12 deficiency and vitamin D deficiency so we'll check these as well. Lab slip given today she can go anytime.  Bruising on anterior legs - she fell back in the winter on some steps and bruised her anterior legs. She still has some discoloration the scan is at the present not completely resolved. She mentioned it to Dr. Ivan Anchors and he did a PT INR that was normal. I gave her reassurance. Certainly after a significant trauma to the soft tissue can cause constipation as well as abnormal pigmentation of the skin I suspect that is what has happened. It may or may not resolve. Certainly if she feels it's getting worse or notices any new lesions or other easy bruising or bleeding please let me know.  Mood-her meds very well controlled  on sertraline. She would like refills. She's tolerating it without any side effects. F/U in 6 mo

## 2012-10-02 ENCOUNTER — Encounter: Payer: BC Managed Care – PPO | Admitting: Family Medicine

## 2012-10-07 LAB — CBC
HCT: 35.8 % — ABNORMAL LOW (ref 36.0–46.0)
Hemoglobin: 12.2 g/dL (ref 12.0–15.0)
MCV: 80.6 fL (ref 78.0–100.0)
Platelets: 231 10*3/uL (ref 150–400)
WBC: 5.8 10*3/uL (ref 4.0–10.5)

## 2012-10-07 LAB — COMPLETE METABOLIC PANEL WITH GFR
ALT: 9 U/L (ref 0–35)
AST: 13 U/L (ref 0–37)
Albumin: 3.9 g/dL (ref 3.5–5.2)
BUN: 7 mg/dL (ref 6–23)
CO2: 24 mEq/L (ref 19–32)
Calcium: 8.7 mg/dL (ref 8.4–10.5)
Chloride: 106 mEq/L (ref 96–112)
Creat: 0.58 mg/dL (ref 0.50–1.10)
Total Bilirubin: 0.3 mg/dL (ref 0.3–1.2)

## 2012-10-07 LAB — LIPID PANEL
HDL: 90 mg/dL (ref >39)
LDL Cholesterol: 47 mg/dL (ref 0–99)
Triglycerides: 122 mg/dL (ref <150)

## 2012-10-07 LAB — VITAMIN B12: Vitamin B-12: 337 pg/mL (ref 211–911)

## 2012-10-08 ENCOUNTER — Encounter: Payer: Self-pay | Admitting: Family Medicine

## 2012-10-08 ENCOUNTER — Ambulatory Visit (INDEPENDENT_AMBULATORY_CARE_PROVIDER_SITE_OTHER): Payer: BC Managed Care – PPO | Admitting: Family Medicine

## 2012-10-08 VITALS — BP 116/80 | HR 69 | Temp 98.1°F | Wt 179.0 lb

## 2012-10-08 DIAGNOSIS — J329 Chronic sinusitis, unspecified: Secondary | ICD-10-CM

## 2012-10-08 DIAGNOSIS — R05 Cough: Secondary | ICD-10-CM

## 2012-10-08 DIAGNOSIS — A499 Bacterial infection, unspecified: Secondary | ICD-10-CM

## 2012-10-08 LAB — VITAMIN D 25 HYDROXY (VIT D DEFICIENCY, FRACTURES): Vit D, 25-Hydroxy: 29 ng/mL — ABNORMAL LOW (ref 30–89)

## 2012-10-08 MED ORDER — HYDROCODONE-HOMATROPINE 5-1.5 MG/5ML PO SYRP: 5.0000 mL | ORAL_SOLUTION | Freq: Every evening | ORAL | Status: DC | PRN

## 2012-10-08 MED ORDER — AZITHROMYCIN 250 MG PO TABS: ORAL_TABLET | ORAL | Status: AC

## 2012-10-08 NOTE — Progress Notes (Signed)
CC: Rebecca Shea is a 42 y.o. female is here for Sinusitis   Subjective: HPI:  Patient complains of facial pressure below both eyes along with nasal congestion of moderate severity that has been present for one week worsening on a daily basis. Worse when leaning forward or lying down. Improves slightly with Mucinex, standing, NyQuil. Over the past 3 days has developed a nonproductive cough worse when lying down worse in her sleep interfering with getting to sleep. Symptoms are present on a daily basis. Symptoms came on abruptly. She denies fevers but has had night sweats past 2 nights. Denies chills, nausea, vomiting, chest pain, shortness of breath, wheezing, abdominal pain     Review Of Systems Outlined In HPI  Past Medical History  Diagnosis Date  . Anxiety 12/26/2011     No family history on file.   History  Substance Use Topics  . Smoking status: Never Smoker   . Smokeless tobacco: Not on file  . Alcohol Use: Not on file     Objective: Filed Vitals:   10/08/12 1541  BP: 116/80  Pulse: 69  Temp: 98.1 F (36.7 C)    General: Alert and Oriented, No Acute Distress HEENT: Pupils equal, round, reactive to light. Conjunctivae clear.  External ears unremarkable, canals clear with intact TMs with appropriate landmarks.  Middle ear appears open without effusion. Pink inferior turbinates.  Moist mucous membranes, pharynx without inflammation nor lesions however moderate cobblestoning .  Neck supple without palpable lymphadenopathy nor abnormal masses. maxillary sinus tenderness on the left Lungs: Clear to auscultation bilaterally, no wheezing/ronchi/rales.  Comfortable work of breathing. Good air movement.occasional coughing  Cardiac: Regular rate and rhythm. Normal S1/S2.  No murmurs, rubs, nor gallops.   Mental Status: No depression, anxiety, nor agitation. Skin: Warm and dry.  Assessment & Plan: Rebecca Shea was seen today for sinusitis.  Diagnoses and associated orders for this  visit:  Cough - HYDROcodone-homatropine (HYCODAN) 5-1.5 MG/5ML syrup; Take 5 mLs by mouth at bedtime as needed for cough.  Bacterial sinusitis - azithromycin (ZITHROMAX) 250 MG tablet; Take two tabs at once on day 1, then one tab daily on days 2-5.     bacterial sinusitis: She prefers azithromycin over Augmentin given history of yeast infections with Augmentin, she will call on Friday if not improved and will then switch to Augmentin. Continue Mucinex, consider pseudoephedrine nasal saline washes. Cough should improve with sinus improvement, Hycodan to help with sleep until that time  Return if symptoms worsen or fail to improve.

## 2012-10-10 ENCOUNTER — Telehealth: Payer: Self-pay | Admitting: *Deleted

## 2012-10-10 MED ORDER — AMOXICILLIN-POT CLAVULANATE 500-125 MG PO TABS: ORAL_TABLET | ORAL | Status: AC

## 2012-10-10 NOTE — Telephone Encounter (Signed)
Pt states she feels somewhat better but still has a cough and some facial pressure. Advised per last progress note that we could send the augmentin into her pharm.Pt agreed.

## 2012-10-10 NOTE — Telephone Encounter (Signed)
Sent to pharmacy 

## 2012-10-10 NOTE — Telephone Encounter (Signed)
Pt aware.

## 2012-11-03 ENCOUNTER — Other Ambulatory Visit: Payer: Self-pay | Admitting: Family Medicine

## 2012-11-20 ENCOUNTER — Other Ambulatory Visit: Payer: Self-pay | Admitting: Family Medicine

## 2012-12-08 ENCOUNTER — Ambulatory Visit (INDEPENDENT_AMBULATORY_CARE_PROVIDER_SITE_OTHER): Payer: BC Managed Care – PPO | Admitting: Family Medicine

## 2012-12-08 ENCOUNTER — Encounter: Payer: Self-pay | Admitting: Family Medicine

## 2012-12-08 VITALS — BP 119/81 | HR 67 | Wt 183.0 lb

## 2012-12-08 DIAGNOSIS — R51 Headache: Secondary | ICD-10-CM

## 2012-12-08 DIAGNOSIS — W19XXXA Unspecified fall, initial encounter: Secondary | ICD-10-CM

## 2012-12-08 DIAGNOSIS — Z9181 History of falling: Secondary | ICD-10-CM

## 2012-12-08 NOTE — Progress Notes (Signed)
CC: Rebecca Shea is a 42 y.o. female is here for hospital f/u   Subjective: HPI:  Patient presents for emergency room followup. On Saturday over the weekend she was walking on a flat surface and for reasons unknown to her begin to fall forward she immediately subconsciously stuck her left foot out but did not catch her fall she fell forward striking her left hand and left forehead. She was in her regular state of health just prior and during the fall denies any lightheadedness, dizziness, motor or sensory disturbances. She was able to get up without assistance immediately. Later that day she had sudden onset of headache and in hindsight noticed that she was having trouble speaking quickly. She was seen in emergency room and tells me a CT scan was done with no abnormality. Within hours after this she had complete resolution of tripping over her words when speaking quickly.  She's had a lingering headache described as a dull headache in the left forehead the exact site she struck her head.. recently remotely she denies nausea, dizziness, fluctuating emotions, nor motor or sensory disturbances. She's concerned mostly because a similar episode of above occurred 2 weeks ago for reasons unknown to her. She does note that both of these happened when she was intensely worried and thinking about psychological stressors in her life.   Recent and remote she denies rapid heartbeat, irregular heartbeat, wheezing, shortness of breath, chest pain, limb claudication, tremor, coordination difficulty, confusion, hearing loss, tinnitus. During this incident she denies knee pain or legs giving out nor catching or locking of the knees or any joints   Review Of Systems Outlined In HPI  Past Medical History  Diagnosis Date  . Anxiety 12/26/2011     No family history on file.   History  Substance Use Topics  . Smoking status: Never Smoker   . Smokeless tobacco: Not on file  . Alcohol Use: Not on file      Objective: Filed Vitals:   12/08/12 1323  BP: 119/81  Pulse: 67    General: Alert and Oriented, No Acute Distress HEENT: Pupils equal, round, reactive to light. Conjunctivae clear.  External ears unremarkable, canals clear with intact TMs with appropriate landmarks.  Middle ear appears open without effusion. Pink inferior turbinates.  Moist mucous membranes, pharynx without inflammation nor lesions.  Neck supple without palpable lymphadenopathy nor abnormal masses. Neuro: CN II-XII grossly intact, full strength/rom of all four extremities, C5/L4/S1 DTRs 2/4 bilaterally, gait normal, rapid alternating movements normal, heel-shin test normal, Rhomberg normal. Lungs: Clear to auscultation bilaterally, no wheezing/ronchi/rales.  Comfortable work of breathing. Good air movement. Cardiac: Regular rate and rhythm. Normal S1/S2.  No murmurs, rubs, nor gallops.   Extremities: No peripheral edema.  Strong peripheral pulses.  Mental Status: No depression, anxiety, nor agitation. Skin: Warm and dry.  Assessment & Plan: Rebecca Shea was seen today for hospital f/u.  Diagnoses and associated orders for this visit:  Falls, initial encounter - Ambulatory referral to Physical Therapy    Reassurance provided to patient low likelihood/suspicion of cardiopulmonary or neurologic etiology of her falls other than relative poor proprioception compound by distractibility. We discussed starting formal physical therapy to help with proprioception, follow up with PCP at her convenience if another fall occurs.Signs and symptoms requring emergent/urgent reevaluation were discussed with the patient.  Return if symptoms worsen or fail to improve.   25 minutes spent face-to-face during visit today of which at least 50% was counseling or coordinating care regarding falls  and hospital followup.

## 2012-12-09 ENCOUNTER — Telehealth: Payer: Self-pay | Admitting: Family Medicine

## 2012-12-09 NOTE — Telephone Encounter (Signed)
Sue Lush, Will you please let mrs. Kathol know that the records sent to [place] from [facility] confirm that her CT scan did not show any abnormality of the brain, no change from the advice on PT yesterday.

## 2012-12-09 NOTE — Telephone Encounter (Signed)
Pt advised.

## 2012-12-17 ENCOUNTER — Ambulatory Visit (INDEPENDENT_AMBULATORY_CARE_PROVIDER_SITE_OTHER): Payer: BC Managed Care – PPO | Admitting: Physical Therapy

## 2012-12-17 DIAGNOSIS — M6281 Muscle weakness (generalized): Secondary | ICD-10-CM

## 2012-12-17 DIAGNOSIS — R269 Unspecified abnormalities of gait and mobility: Secondary | ICD-10-CM

## 2012-12-17 DIAGNOSIS — W19XXXA Unspecified fall, initial encounter: Secondary | ICD-10-CM

## 2012-12-23 ENCOUNTER — Encounter (INDEPENDENT_AMBULATORY_CARE_PROVIDER_SITE_OTHER): Payer: BC Managed Care – PPO | Admitting: Physical Therapy

## 2012-12-23 DIAGNOSIS — R269 Unspecified abnormalities of gait and mobility: Secondary | ICD-10-CM

## 2012-12-23 DIAGNOSIS — W19XXXA Unspecified fall, initial encounter: Secondary | ICD-10-CM

## 2012-12-23 DIAGNOSIS — M6281 Muscle weakness (generalized): Secondary | ICD-10-CM

## 2012-12-31 ENCOUNTER — Encounter: Payer: BC Managed Care – PPO | Admitting: Physical Therapy

## 2013-01-07 ENCOUNTER — Encounter: Payer: BC Managed Care – PPO | Admitting: Physical Therapy

## 2013-01-08 ENCOUNTER — Encounter: Payer: BC Managed Care – PPO | Admitting: Physical Therapy

## 2013-01-14 ENCOUNTER — Encounter: Payer: BC Managed Care – PPO | Admitting: Physical Therapy

## 2013-01-14 DIAGNOSIS — R269 Unspecified abnormalities of gait and mobility: Secondary | ICD-10-CM

## 2013-01-14 DIAGNOSIS — M6281 Muscle weakness (generalized): Secondary | ICD-10-CM

## 2013-01-14 DIAGNOSIS — W19XXXA Unspecified fall, initial encounter: Secondary | ICD-10-CM

## 2013-01-26 ENCOUNTER — Encounter (INDEPENDENT_AMBULATORY_CARE_PROVIDER_SITE_OTHER): Payer: BC Managed Care – PPO | Admitting: Physical Therapy

## 2013-01-26 DIAGNOSIS — R269 Unspecified abnormalities of gait and mobility: Secondary | ICD-10-CM

## 2013-01-26 DIAGNOSIS — W19XXXA Unspecified fall, initial encounter: Secondary | ICD-10-CM

## 2013-02-05 ENCOUNTER — Encounter (INDEPENDENT_AMBULATORY_CARE_PROVIDER_SITE_OTHER): Payer: BC Managed Care – PPO | Admitting: Physical Therapy

## 2013-02-05 DIAGNOSIS — R269 Unspecified abnormalities of gait and mobility: Secondary | ICD-10-CM

## 2013-02-05 DIAGNOSIS — M6281 Muscle weakness (generalized): Secondary | ICD-10-CM

## 2013-02-05 DIAGNOSIS — W19XXXA Unspecified fall, initial encounter: Secondary | ICD-10-CM

## 2013-02-12 ENCOUNTER — Encounter (INDEPENDENT_AMBULATORY_CARE_PROVIDER_SITE_OTHER): Payer: BC Managed Care – PPO | Admitting: Physical Therapy

## 2013-02-12 DIAGNOSIS — M6281 Muscle weakness (generalized): Secondary | ICD-10-CM

## 2013-02-12 DIAGNOSIS — R269 Unspecified abnormalities of gait and mobility: Secondary | ICD-10-CM

## 2013-02-12 DIAGNOSIS — W19XXXA Unspecified fall, initial encounter: Secondary | ICD-10-CM

## 2013-02-18 ENCOUNTER — Encounter: Payer: BC Managed Care – PPO | Admitting: Physical Therapy

## 2013-02-26 ENCOUNTER — Other Ambulatory Visit: Payer: Self-pay

## 2013-05-24 ENCOUNTER — Other Ambulatory Visit: Payer: Self-pay | Admitting: Family Medicine

## 2013-06-25 ENCOUNTER — Other Ambulatory Visit: Payer: Self-pay | Admitting: Family Medicine

## 2013-07-22 ENCOUNTER — Other Ambulatory Visit: Payer: Self-pay | Admitting: Family Medicine

## 2013-08-14 ENCOUNTER — Other Ambulatory Visit: Payer: Self-pay | Admitting: Obstetrics and Gynecology

## 2013-08-14 DIAGNOSIS — R928 Other abnormal and inconclusive findings on diagnostic imaging of breast: Secondary | ICD-10-CM

## 2013-08-21 ENCOUNTER — Other Ambulatory Visit: Payer: Self-pay | Admitting: Family Medicine

## 2013-08-24 ENCOUNTER — Ambulatory Visit
Admission: RE | Admit: 2013-08-24 | Discharge: 2013-08-24 | Disposition: A | Discharge status: Home / Self Care | Payer: BC Managed Care – PPO | Source: Ambulatory Visit | Attending: Obstetrics and Gynecology | Admitting: Obstetrics and Gynecology

## 2013-08-24 DIAGNOSIS — R928 Other abnormal and inconclusive findings on diagnostic imaging of breast: Secondary | ICD-10-CM

## 2013-08-24 IMAGING — MG MM DIAGNOSTIC UNILATERAL L
2 series · 2 of 2 positions shown · non-contrast
Comparison: Prior exams [87] and [87]

CLINICAL DATA: Screening callback for questioned left breast mass

EXAM:
DIGITAL DIAGNOSTIC  left MAMMOGRAM

[L CC]
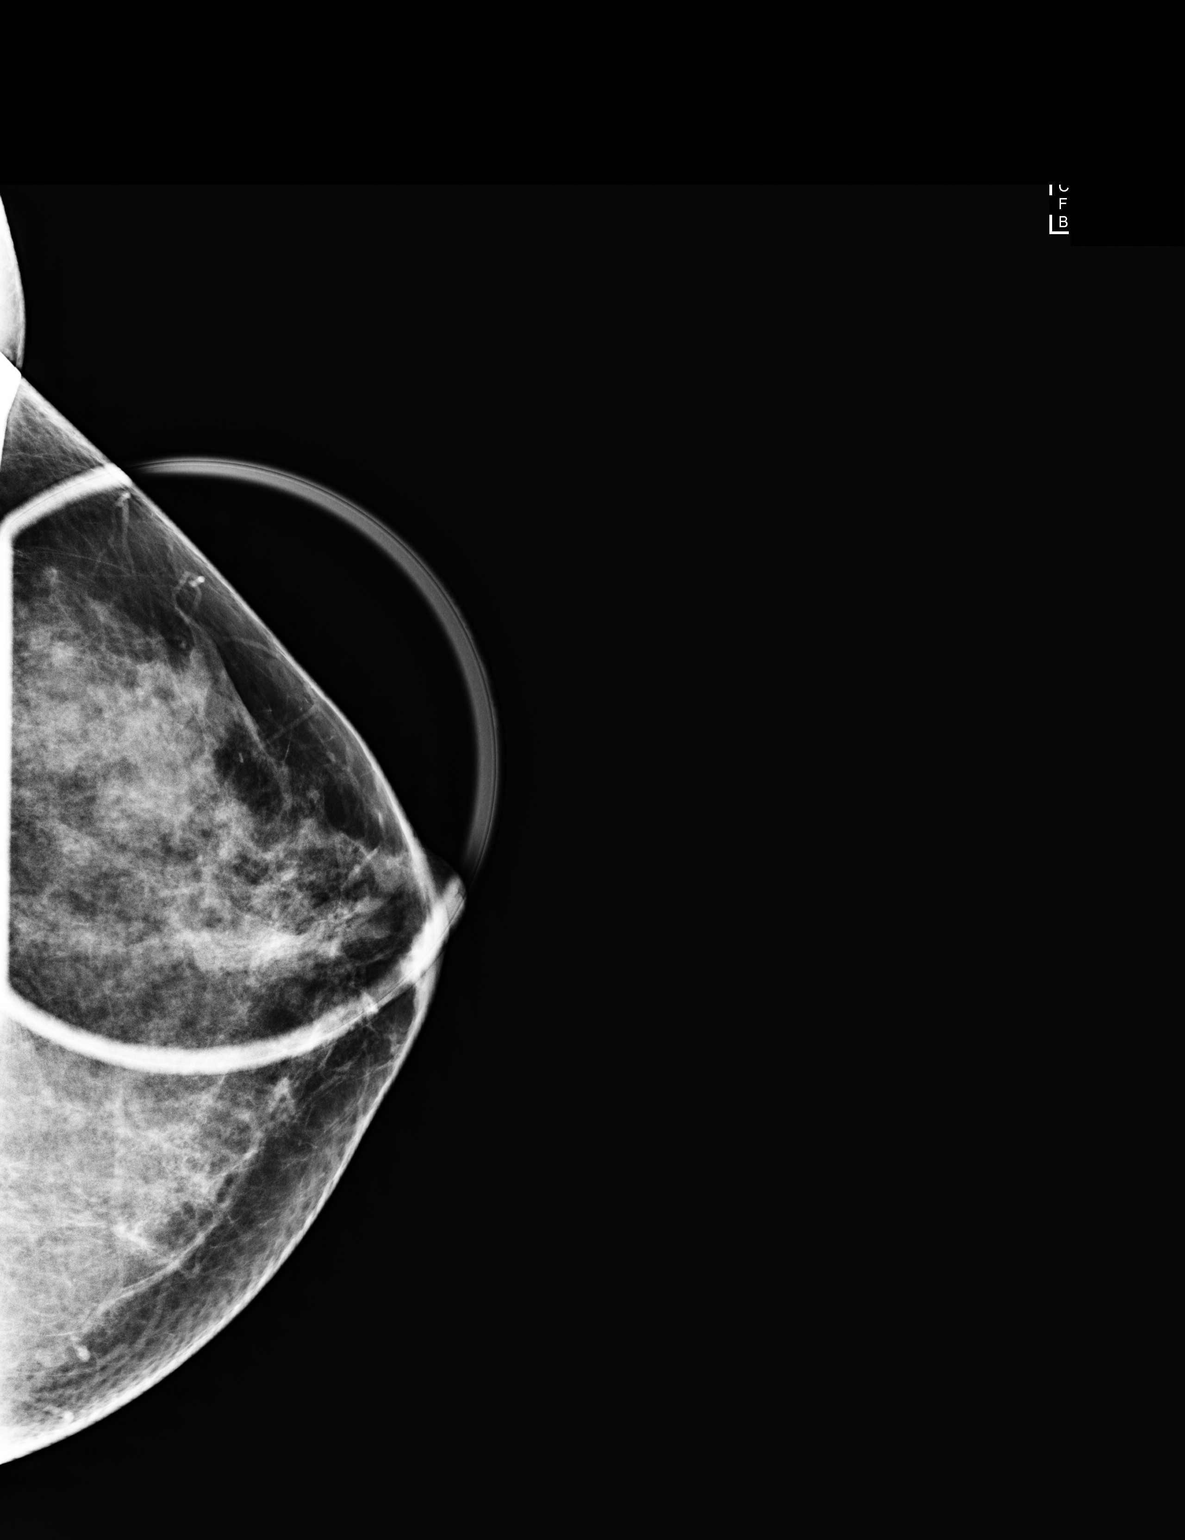

[L MLO]
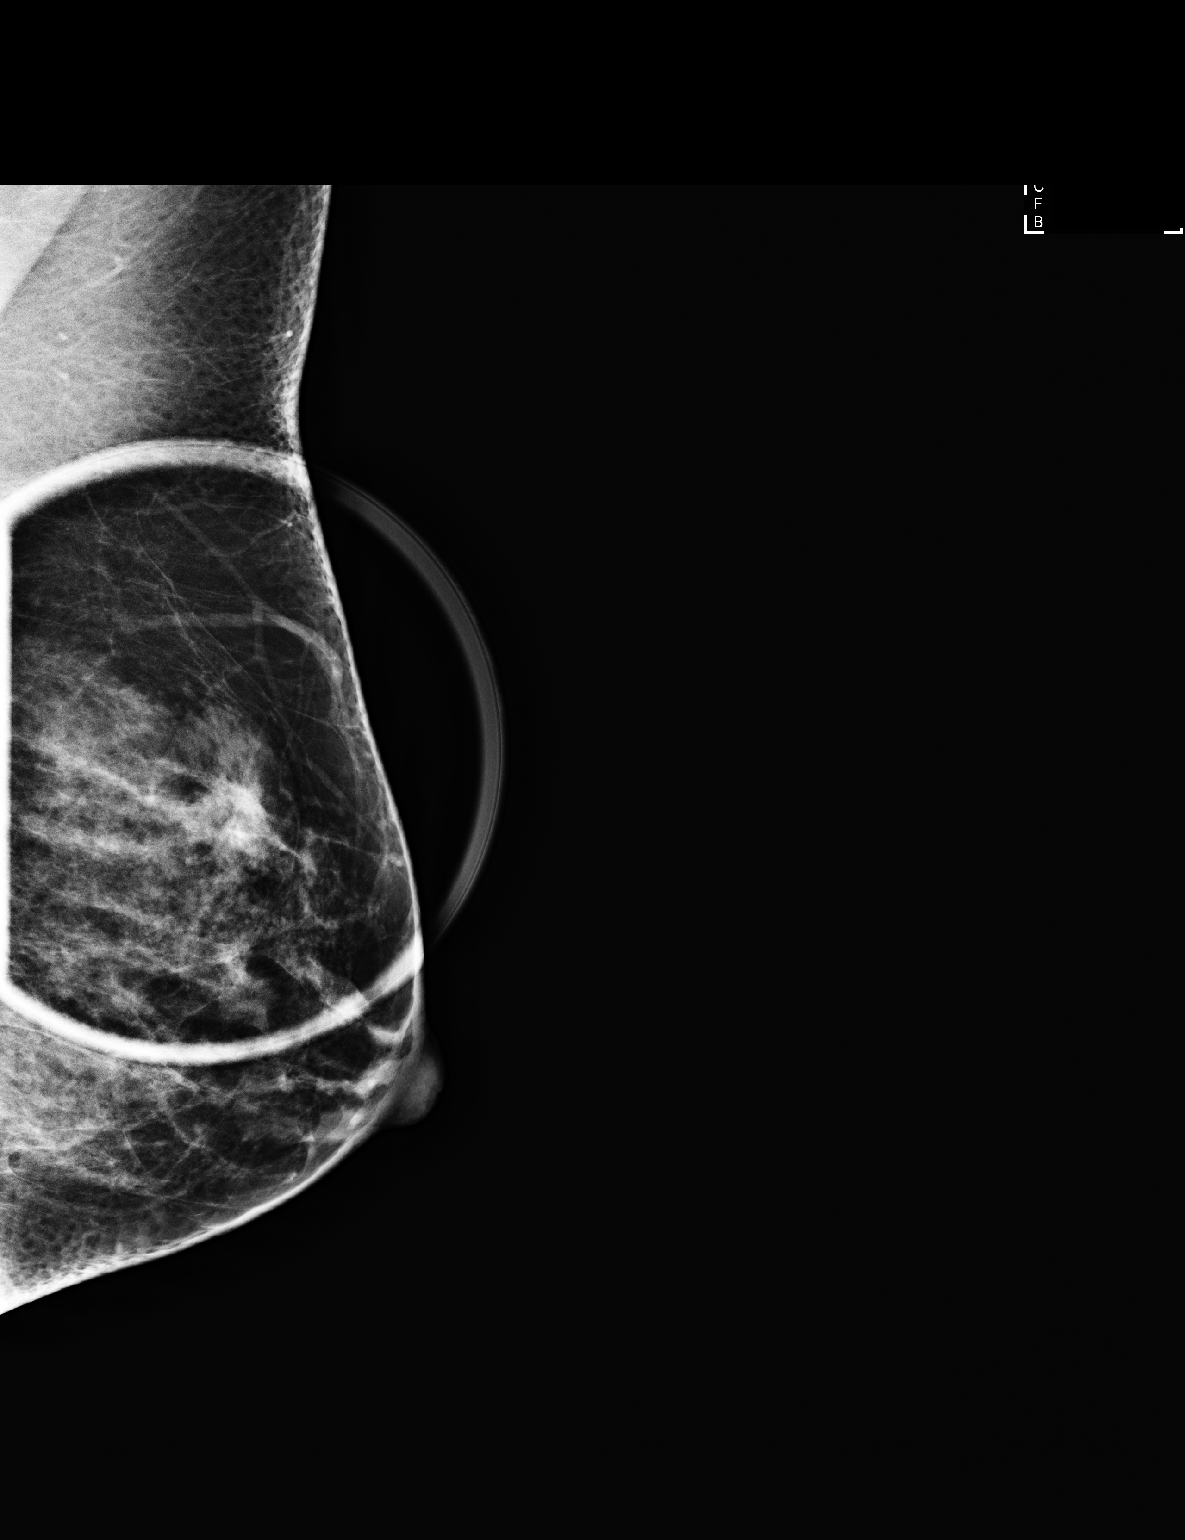

[2 of 2 positions shown; findings below may reference images not displayed]

ACR Breast Density Category c: The breast tissue is heterogeneously
dense, which may obscure small masses.
FINDINGS: No persistent abnormality is identified in the left upper outer
quadrant. Island of asymmetric glandular parenchyma changes
conformation and demonstrates pliability, at the site of the
questioned finding.
IMPRESSION: No evidence for malignancy in the left breast.

RECOMMENDATION:
Screening mammogram in one year.(Code:[87])

I have discussed the findings and recommendations with the patient.
Results were also provided in writing at the conclusion of the
visit. If applicable, a reminder letter will be sent to the patient
regarding the next appointment.

BI-RADS CATEGORY  1: Negative.

## 2013-09-23 ENCOUNTER — Other Ambulatory Visit: Payer: Self-pay | Admitting: Family Medicine

## 2014-02-11 ENCOUNTER — Emergency Department
Admission: EM | Admit: 2014-02-11 | Discharge: 2014-02-11 | Disposition: A | Discharge status: Home / Self Care | Payer: BC Managed Care – PPO | Source: Home / Self Care | Attending: Emergency Medicine | Admitting: Emergency Medicine

## 2014-02-11 ENCOUNTER — Encounter: Payer: Self-pay | Admitting: Emergency Medicine

## 2014-02-11 ENCOUNTER — Emergency Department (INDEPENDENT_AMBULATORY_CARE_PROVIDER_SITE_OTHER): Payer: BC Managed Care – PPO

## 2014-02-11 DIAGNOSIS — J9811 Atelectasis: Secondary | ICD-10-CM

## 2014-02-11 DIAGNOSIS — R609 Edema, unspecified: Secondary | ICD-10-CM

## 2014-02-11 LAB — POCT URINALYSIS DIP (MANUAL ENTRY)
BILIRUBIN UA: NEGATIVE
BILIRUBIN UA: NEGATIVE
Glucose, UA: NEGATIVE
Leukocytes, UA: NEGATIVE
Nitrite, UA: NEGATIVE
Protein Ur, POC: NEGATIVE
Spec Grav, UA: 1.01 (ref 1.005–1.03)
Urobilinogen, UA: 0.2 (ref 0–1)
pH, UA: 7 (ref 5–8)

## 2014-02-11 IMAGING — CR DG CHEST 2V
2 series · 2 of 2 positions shown · non-contrast
Comparison: [DATE]

CLINICAL DATA: Swelling and shortness of breath for 5 days, anxiety

EXAM:
CHEST  2 VIEW

[view not recorded (1 of 2)]
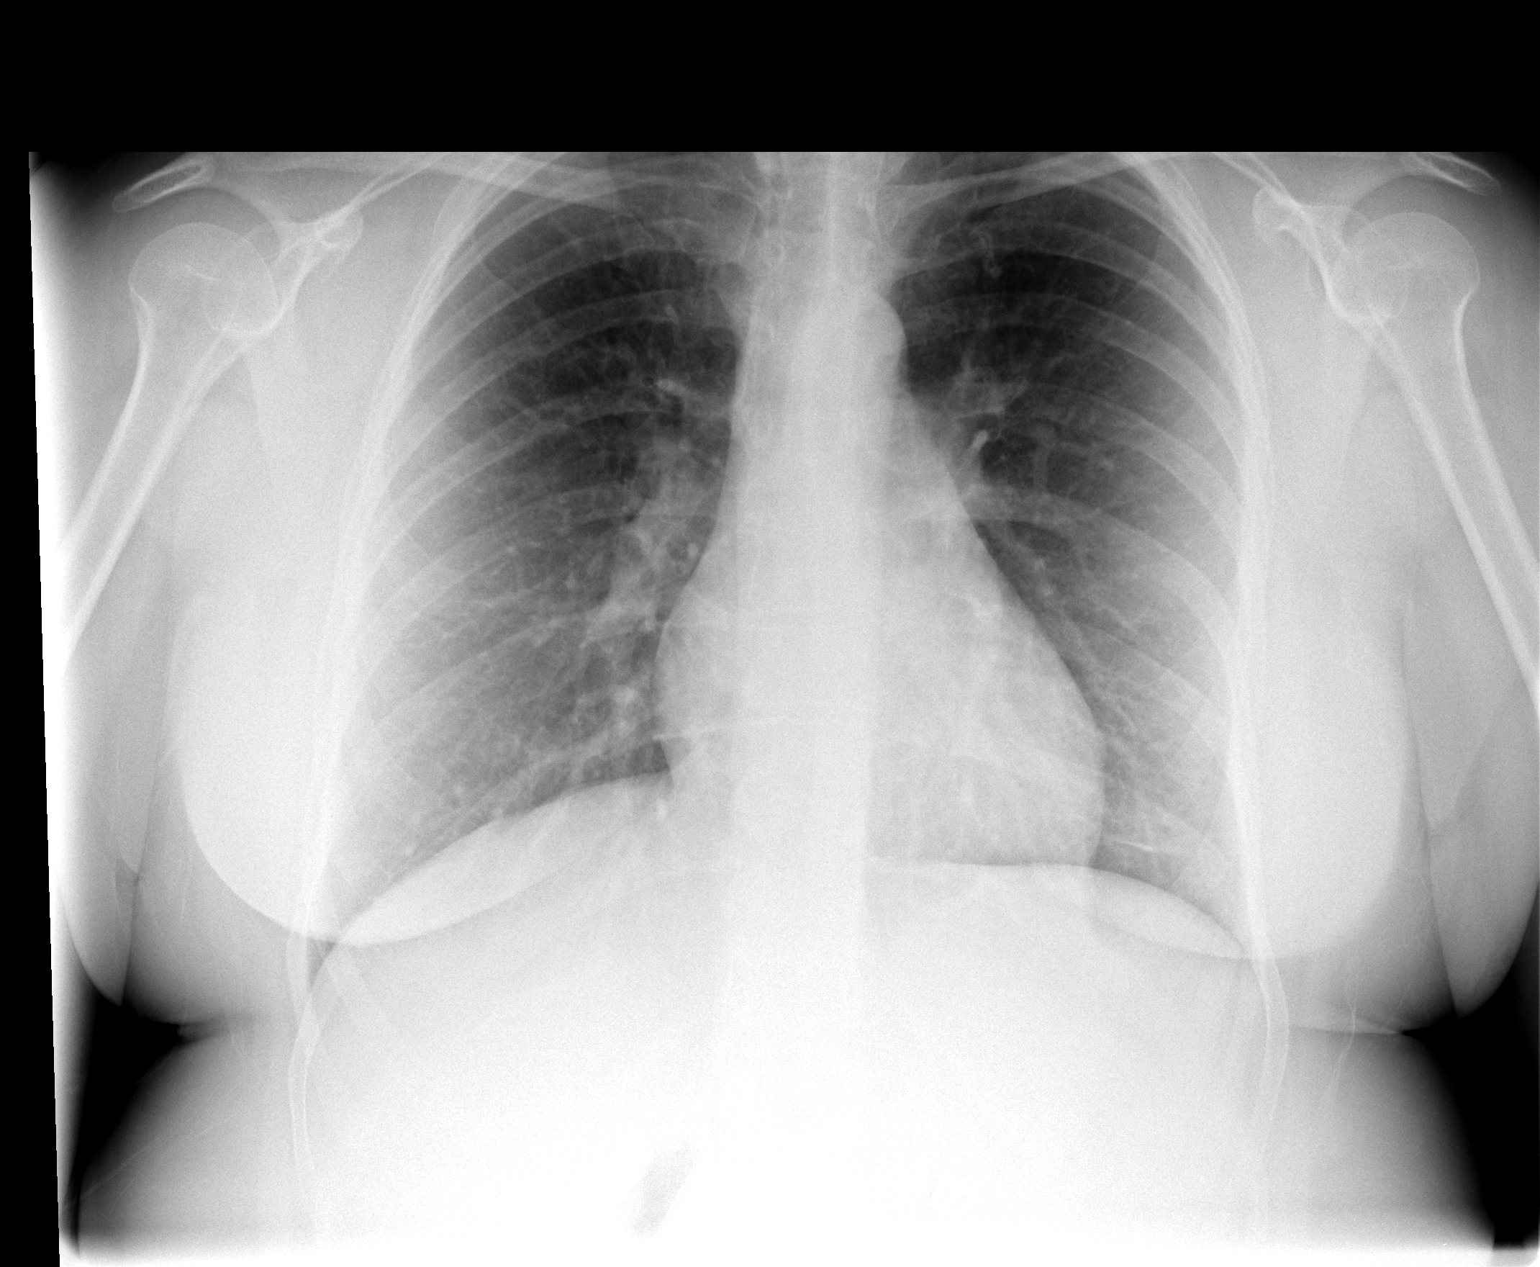

[view not recorded (2 of 2)]
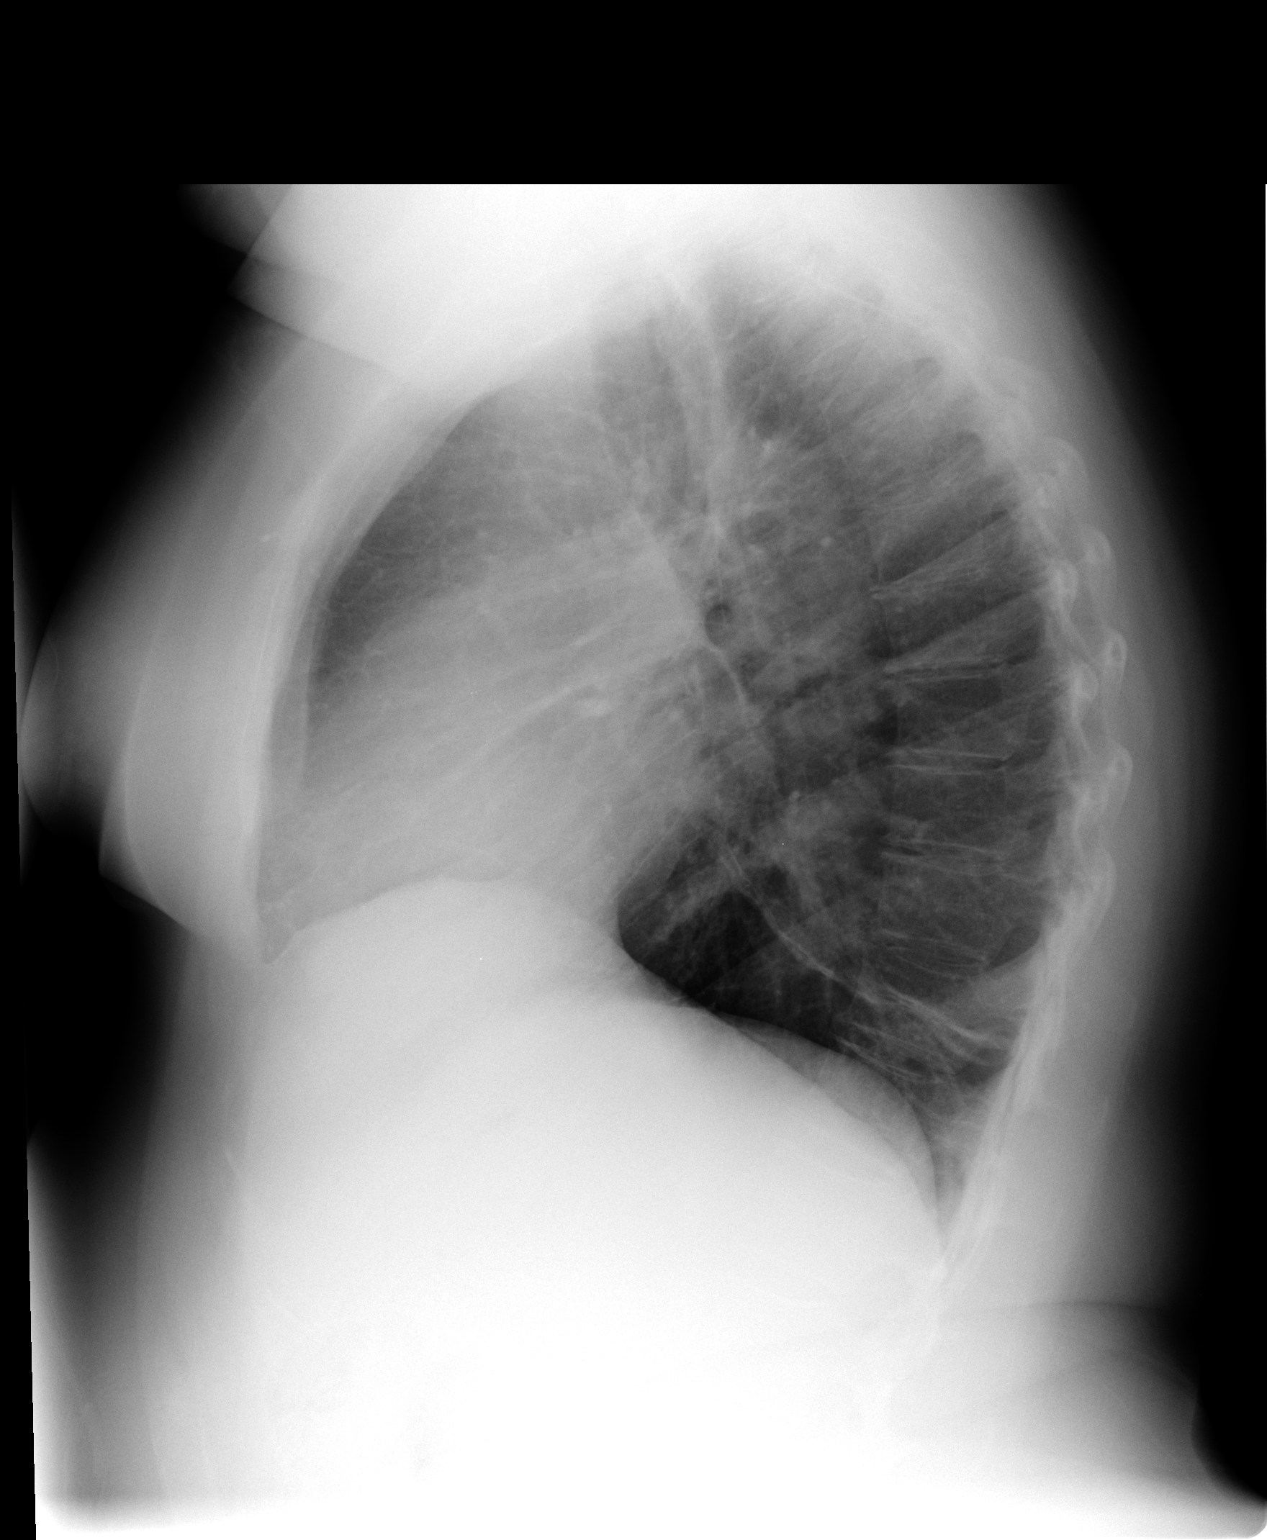

[2 of 2 positions shown; findings below may reference images not displayed]

FINDINGS: Cardiomediastinal silhouette is stable. No acute infiltrate or
pleural effusion. No pulmonary edema. Bony thorax is unremarkable.
There is linear atelectasis in left lower lobe.
IMPRESSION: No acute infiltrate or pulmonary edema. Linear atelectasis in left
lower lobe.

## 2014-02-11 MED ORDER — FUROSEMIDE 20 MG PO TABS: 20.0000 mg | ORAL_TABLET | Freq: Every day | ORAL | Status: DC

## 2014-02-11 NOTE — ED Provider Notes (Signed)
CSN: [id number]     Arrival date & time 02/11/14  1441 History   First MD Initiated Contact with Patient 02/11/14 1503     Chief Complaint  Patient presents with  . Foot Swelling   (Consider location/radiation/quality/duration/timing/severity/associated sxs/prior Treatment) Patient is a 43 y.o. female presenting with extremity pain. The history is provided by the patient. No language interpreter was used.  Extremity Pain This is a new problem. The current episode started more than 1 week ago. The problem occurs hourly. The problem has been gradually worsening. Associated symptoms include shortness of breath. Pertinent negatives include no chest pain. Nothing aggravates the symptoms. Nothing relieves the symptoms. She has tried nothing for the symptoms. The treatment provided no relief.  Pt complains of swelling to bilat lower legs.  Pt reports she felt short of breath last pm.   Pt is suppose to see Dr. Ivan Anchorshommel tomorrow  Past Medical History  Diagnosis Date  . Anxiety 12/26/2011   History reviewed. No pertinent past surgical history. No family history on file. History  Substance Use Topics  . Smoking status: Never Smoker   . Smokeless tobacco: Not on file  . Alcohol Use: Yes   OB History   Grav Para Term Preterm Abortions TAB SAB Ect Mult Living                 Review of Systems  Respiratory: Positive for shortness of breath.   Cardiovascular: Negative for chest pain.  All other systems reviewed and are negative.   Allergies  Sulfonamide derivatives  Home Medications   Prior to Admission medications   Medication Sig Start Date End Date Taking? Authorizing Provider  pantoprazole (PROTONIX) 20 MG tablet Take 20 mg by mouth daily.   Yes Historical Provider, MD  tranexamic acid (LYSTEDA) 650 MG TABS tablet Take 1,300 mg by mouth 3 (three) times daily.   Yes Historical Provider, MD  sertraline (ZOLOFT) 50 MG tablet TAKE 1 TABLET (50 MG TOTAL) BY MOUTH DAILY. 09/23/13   Agapito Gamesatherine D  Metheney, MD   BP 145/89  Pulse 82  Temp(Src) 97.4 F (36.3 C) (Oral)  Ht 5\' 1"  (1.549 m)  Wt 204 lb (92.534 kg)  BMI 38.57 kg/m2  SpO2 98%  LMP 01/18/2014 Physical Exam  Nursing note and vitals reviewed. Constitutional: She is oriented to person, place, and time. She appears well-developed and well-nourished.  HENT:  Head: Normocephalic.  Right Ear: External ear normal.  Left Ear: External ear normal.  Nose: Nose normal.  Mouth/Throat: Oropharynx is clear and moist.  Eyes: Pupils are equal, round, and reactive to light.  Neck: Normal range of motion.  Cardiovascular: Normal rate.   Pulmonary/Chest: Effort normal.  Abdominal: Soft.  Musculoskeletal: She exhibits edema and tenderness.  2 plus pitting edema feet bilat  Neurological: She is alert and oriented to person, place, and time. She has normal reflexes.  Skin: Skin is warm.  Psychiatric: She has a normal mood and affect.    ED Course  Procedures (including critical care time) Labs Review Labs Reviewed  CBC WITH DIFFERENTIAL  COMPREHENSIVE METABOLIC PANEL  POCT URINALYSIS DIP (MANUAL ENTRY)   Labs pending.   I will check tomorrow and pt will see Dr. Ivan AnchorsHommel Imaging Review Dg Chest 2 View  02/11/2014   CLINICAL DATA:  Swelling and shortness of breath for 5 days, anxiety  EXAM: CHEST  2 VIEW  COMPARISON:  10/30/2011  FINDINGS: Cardiomediastinal silhouette is stable. No acute infiltrate or pleural effusion. No pulmonary edema.  Bony thorax is unremarkable. There is linear atelectasis in left lower lobe.  IMPRESSION: No acute infiltrate or pulmonary edema. Linear atelectasis in left lower lobe.   Electronically Signed   By: Natasha MeadLiviu  Pop M.D.   On: 02/11/2014 15:54     MDM  Chest xray  Shows linear atelectasis,  EKg  NS 70  No st changes.     1. Swelling    I will treat pt with lasix. Pt advised to elevate feet.   AVS See Dr. Ivan AnchorsHommel tomorrow as scheduled    Elson AreasLeslie K Sofia, PA-C 02/11/14 1615

## 2014-02-11 NOTE — Discharge Instructions (Signed)
Peripheral Edema °You have swelling in your legs (peripheral edema). This swelling is due to excess accumulation of salt and water in your body. Edema may be a sign of heart, kidney or liver disease, or a side effect of a medication. It may also be due to problems in the leg veins. Elevating your legs and using special support stockings may be very helpful, if the cause of the swelling is due to poor venous circulation. Avoid long periods of standing, whatever the cause. °Treatment of edema depends on identifying the cause. Chips, pretzels, pickles and other salty foods should be avoided. Restricting salt in your diet is almost always needed. Water pills (diuretics) are often used to remove the excess salt and water from your body via urine. These medicines prevent the kidney from reabsorbing sodium. This increases urine flow. °Diuretic treatment may also result in lowering of potassium levels in your body. Potassium supplements may be needed if you have to use diuretics daily. Daily weights can help you keep track of your progress in clearing your edema. You should call your caregiver for follow up care as recommended. °SEEK IMMEDIATE MEDICAL CARE IF:  °· You have increased swelling, pain, redness, or heat in your legs. °· You develop shortness of breath, especially when lying down. °· You develop chest or abdominal pain, weakness, or fainting. °· You have a fever. °Document Released: 05/17/2004 Document Revised: 07/02/2011 Document Reviewed: 04/27/2009 °[employer]® Patient Information ©2015 [employer]. This information is not intended to replace advice given to you by your health care provider. Make sure you discuss any questions you have with your health care provider. ° °

## 2014-02-11 NOTE — ED Notes (Signed)
Swollen feet x 6 days

## 2014-02-12 ENCOUNTER — Telehealth: Payer: Self-pay | Admitting: *Deleted

## 2014-02-12 ENCOUNTER — Ambulatory Visit: Payer: BC Managed Care – PPO | Admitting: Family Medicine

## 2014-02-12 LAB — POCT CBC W AUTO DIFF (K'VILLE URGENT CARE)

## 2014-02-12 LAB — COMPREHENSIVE METABOLIC PANEL
ALT: 12 U/L (ref 0–35)
AST: 14 U/L (ref 0–37)
Albumin: 4 g/dL (ref 3.5–5.2)
Alkaline Phosphatase: 70 U/L (ref 39–117)
BUN: 8 mg/dL (ref 6–23)
CALCIUM: 9.2 mg/dL (ref 8.4–10.5)
CHLORIDE: 106 meq/L (ref 96–112)
CO2: 25 mEq/L (ref 19–32)
Creat: 0.62 mg/dL (ref 0.50–1.10)
Glucose, Bld: 82 mg/dL (ref 70–99)
Potassium: 3.9 mEq/L (ref 3.5–5.3)
SODIUM: 140 meq/L (ref 135–145)
TOTAL PROTEIN: 6.8 g/dL (ref 6.0–8.3)
Total Bilirubin: 0.5 mg/dL (ref 0.2–1.2)

## 2014-02-12 LAB — CBC WITH DIFFERENTIAL/PLATELET
Basophils Absolute: 0.1 10*3/uL (ref 0.0–0.1)
Basophils Relative: 1 % (ref 0–1)
EOS PCT: 3 % (ref 0–5)
Eosinophils Absolute: 0.2 10*3/uL (ref 0.0–0.7)
HCT: 36.9 % (ref 36.0–46.0)
Hemoglobin: 12.6 g/dL (ref 12.0–15.0)
LYMPHS ABS: 2.8 10*3/uL (ref 0.7–4.0)
LYMPHS PCT: 35 % (ref 12–46)
MCH: 28.2 pg (ref 26.0–34.0)
MCHC: 34.1 g/dL (ref 30.0–36.0)
MCV: 82.6 fL (ref 78.0–100.0)
Monocytes Absolute: 0.7 10*3/uL (ref 0.1–1.0)
Monocytes Relative: 9 % (ref 3–12)
NEUTROS ABS: 4.1 10*3/uL (ref 1.7–7.7)
NEUTROS PCT: 52 % (ref 43–77)
PLATELETS: 325 10*3/uL (ref 150–400)
RBC: 4.47 MIL/uL (ref 3.87–5.11)
RDW: 15.3 % (ref 11.5–15.5)
WBC: 7.9 10*3/uL (ref 4.0–10.5)

## 2014-02-12 NOTE — ED Provider Notes (Signed)
Medical history/examination/treatment/procedure(s) were performed by non-physician provider and as supervising physician I was immediately available for consultation/collaboration.  David Massey, MD 02/12/14 1245 

## 2014-02-15 ENCOUNTER — Ambulatory Visit (INDEPENDENT_AMBULATORY_CARE_PROVIDER_SITE_OTHER): Payer: BC Managed Care – PPO | Admitting: Family Medicine

## 2014-02-15 ENCOUNTER — Encounter: Payer: Self-pay | Admitting: Family Medicine

## 2014-02-15 VITALS — BP 130/88 | HR 70 | Wt 201.0 lb

## 2014-02-15 DIAGNOSIS — R319 Hematuria, unspecified: Secondary | ICD-10-CM | POA: Diagnosis not present

## 2014-02-15 DIAGNOSIS — R609 Edema, unspecified: Secondary | ICD-10-CM | POA: Diagnosis not present

## 2014-02-15 MED ORDER — FUROSEMIDE 20 MG PO TABS: 20.0000 mg | ORAL_TABLET | Freq: Every day | ORAL | Status: DC

## 2014-02-15 NOTE — Progress Notes (Signed)
CC: Rebecca Shea is a 43 y.o. female is here for f/u labs   Subjective: HPI:  Follow-up edema: Patient reports that last week she had slowly worsening edema localized from the ankle distally worse at the end of the day accompanied by skin pain described as tension and pain. Symptoms are moderate in severity. Nothing particularly made them better or worse other than time. They will improve after elevation of the legs overnight. She denies any change in her sodium intake. She denies any recent or remote chest pain, shortness of breath, orthopnea, nor edema elsewhere. She denies any overlying skin changes at the site of discomfort and edema. EKG with chest x-ray was unremarkable, renal function was normal, anemia was not present and albumin level was normal at urgent care workup last week. She's had resolution of her edema by taking Lasix every night at bedtime. She is currently waking 3-4 times at night to urinate since starting this Lasix.   Review Of Systems Outlined In HPI  Past Medical History  Diagnosis Date  . Anxiety 12/26/2011    No past surgical history on file. No family history on file.  History   Social History  . Marital Status: Married    Spouse Name: N/A    Number of Children: N/A  . Years of Education: N/A   Occupational History  . Not on file.   Social History Main Topics  . Smoking status: Never Smoker   . Smokeless tobacco: Not on file  . Alcohol Use: Yes  . Drug Use: Not on file  . Sexual Activity: Not on file   Other Topics Concern  . Not on file   Social History Narrative  . No narrative on file     Objective: BP 130/88  Pulse 70  Wt 201 lb (91.173 kg)  LMP 01/18/2014  General: Alert and Oriented, No Acute Distress HEENT: Pupils equal, round, reactive to light. Conjunctivae clear.  Moist pedis membranes are unremarkable Lungs: Clear comfortable work of breathing Cardiac: Regular rate and rhythm.  Extremities: No peripheral edema.  Strong  peripheral pulses.  Mental Status: No depression, anxiety, nor agitation. Skin: Warm and dry.  Assessment & Plan: Rebecca Shea was seen today for f/u labs.  Diagnoses and associated orders for this visit:  Hematuria  Edema - BASIC METABOLIC PANEL WITH GFR - Discontinue: furosemide (LASIX) 20 MG tablet; Take 1 tablet (20 mg total) by mouth daily. - furosemide (LASIX) 20 MG tablet; Take 1 tablet (20 mg total) by mouth daily.    Urinalysis last week showed dipstick hematuria, she informs me that she's had this worked up with her urologist in the past and is considered benign. No further workup Edema: Controlled on Lasix encouraged her to take this at least 6 hours before she plans on going to bed to avoid having to wake up to urinate at night. Checking renal function since she's been taking this daily and anticipates taking it daily  Return if symptoms worsen or fail to improve.

## 2014-02-15 NOTE — Patient Instructions (Signed)

## 2014-02-16 LAB — BASIC METABOLIC PANEL WITH GFR
BUN: 12 mg/dL (ref 6–23)
CALCIUM: 9.4 mg/dL (ref 8.4–10.5)
CO2: 25 meq/L (ref 19–32)
CREATININE: 0.56 mg/dL (ref 0.50–1.10)
Chloride: 104 mEq/L (ref 96–112)
GFR, Est Non African American: >89 mL/min
Glucose, Bld: 87 mg/dL (ref 70–99)
Potassium: 4.6 mEq/L (ref 3.5–5.3)
Sodium: 141 mEq/L (ref 135–145)

## 2014-04-14 ENCOUNTER — Encounter: Payer: Self-pay | Admitting: Family Medicine

## 2014-04-14 ENCOUNTER — Ambulatory Visit (INDEPENDENT_AMBULATORY_CARE_PROVIDER_SITE_OTHER): Payer: BC Managed Care – PPO | Admitting: Family Medicine

## 2014-04-14 VITALS — BP 134/88 | HR 92 | Temp 98.0°F | Wt 201.0 lb

## 2014-04-14 DIAGNOSIS — H60392 Other infective otitis externa, left ear: Secondary | ICD-10-CM

## 2014-04-14 DIAGNOSIS — H6692 Otitis media, unspecified, left ear: Secondary | ICD-10-CM

## 2014-04-14 MED ORDER — FLUCONAZOLE 150 MG PO TABS: ORAL_TABLET | ORAL | Status: AC

## 2014-04-14 MED ORDER — LEVOFLOXACIN 500 MG PO TABS: 500.0000 mg | ORAL_TABLET | Freq: Every day | ORAL | Status: DC

## 2014-04-14 MED ORDER — NEOMYCIN-POLYMYXIN-HC 1 % OT SOLN: OTIC | Status: AC

## 2014-04-14 NOTE — Progress Notes (Signed)
CC: Rebecca LippsMelissa C Shea is a 43 y.o. female is here for URI and ear pain  (left)   Subjective: HPI:   complains of  left ear pain that began about 24 hours ago. It is moderate to severe in severity. It radiates into the  Left cheek. Is worse with pressure on the ear or lying on the left side. It has been proceeded by facial pressure, nasal congestion, postnasal drip and a nonproductive cough for the past week all of which seems to be worse on a daily basis despite using leftover  Prescription cough syrup and over-the-counter Mucinex.  No interventions other than above. She denies  Fevers, chills, fatigue, shortness of breath, wheezing, chest pain. Review of systems positive for decreased hearing on the left.  Review Of Systems Outlined In HPI  Past Medical History  Diagnosis Date  . Anxiety 12/26/2011    No past surgical history on file. No family history on file.  History   Social History  . Marital Status: Married    Spouse Name: N/A    Number of Children: N/A  . Years of Education: N/A   Occupational History  . Not on file.   Social History Main Topics  . Smoking status: Never Smoker   . Smokeless tobacco: Not on file  . Alcohol Use: Yes  . Drug Use: Not on file  . Sexual Activity: Not on file   Other Topics Concern  . Not on file   Social History Narrative     Objective: BP 134/88 mmHg  Pulse 92  Temp(Src) 98 F (36.7 C) (Oral)  Wt 201 lb (91.173 kg)  General: Alert and Oriented, No Acute Distress HEENT: Pupils equal, round, reactive to light. Conjunctivae clear.  External ears unremarkable, canals clear with intact TMs with appropriate landmarks.  Left ear canal is mildly erythematous and edematous.   rightMiddle ear appears open without effusion  However left middle ear appears to have a mucoid effusion. Pink inferior turbinates.  Moist mucous membranes, pharynx without inflammation nor lesions.  Neck supple without palpable lymphadenopathy nor abnormal masses. Lungs:  Clear to auscultation bilaterally, no wheezing/ronchi/rales.  Comfortable work of breathing. Good air movement. Extremities: No peripheral edema.  Strong peripheral pulses.  Mental Status: No depression, anxiety, nor agitation. Skin: Warm and dry.  Assessment & Plan: [name] was seen today for uri and ear pain  (left).  Diagnoses and associated orders for this visit:  Acute left otitis media, recurrence not specified, unspecified otitis media type - fluconazole (DIFLUCAN) 150 MG tablet; Take one tab, may take second tab if no improvement after 72 hours. - levofloxacin (LEVAQUIN) 500 MG tablet; Take 1 tablet (500 mg total) by mouth daily. - NEOMYCIN-POLYMYXIN-HYDROCORTISONE (CORTISPORIN) 1 % SOLN otic solution; Four drops in affected ear(s) three times a day, keep in ear(s) for five minutes. Total of ten days.  Otitis, externa, infective, left - fluconazole (DIFLUCAN) 150 MG tablet; Take one tab, may take second tab if no improvement after 72 hours. - levofloxacin (LEVAQUIN) 500 MG tablet; Take 1 tablet (500 mg total) by mouth daily. - NEOMYCIN-POLYMYXIN-HYDROCORTISONE (CORTISPORIN) 1 % SOLN otic solution; Four drops in affected ear(s) three times a day, keep in ear(s) for five minutes. Total of ten days.     mild otitis externa:  Begin Cortisporin drops  otitis media with sinusitis:  She is requesting azithromycin because she wants to take the shortest course of antibiotics as possible. I discussed with her that azithromycin is not the most effective  At treating her condition  And that Levaquin  For 8 days would be acceptable and a relatively short course relative to the ten-day alternatives for other antibiotics.  She was to take shortest course as possible to avoid yeast infection, she was provided with Diflucan if this occurs.   Return if symptoms worsen or fail to improve.

## 2014-04-27 ENCOUNTER — Telehealth: Payer: Self-pay | Admitting: *Deleted

## 2014-04-27 MED ORDER — PREDNISONE 20 MG PO TABS: ORAL_TABLET | ORAL | Status: AC

## 2014-04-27 NOTE — Telephone Encounter (Signed)
Next step would be prednsione taper that [facility]. If no better after three days please let me know and I'll arrange a ENT referral.

## 2014-04-27 NOTE — Telephone Encounter (Signed)
Pt left a message stating she is still having ear pain. Please advise

## 2014-04-27 NOTE — Telephone Encounter (Signed)
Pt.notified

## 2014-08-24 ENCOUNTER — Ambulatory Visit: Payer: BLUE CROSS/BLUE SHIELD | Admitting: Neurology

## 2014-08-26 ENCOUNTER — Ambulatory Visit (INDEPENDENT_AMBULATORY_CARE_PROVIDER_SITE_OTHER): Payer: BLUE CROSS/BLUE SHIELD | Admitting: Neurology

## 2014-08-26 ENCOUNTER — Encounter: Payer: Self-pay | Admitting: Neurology

## 2014-08-26 VITALS — BP 141/91 | HR 74 | Ht 61.0 in | Wt 203.0 lb

## 2014-08-26 DIAGNOSIS — G43009 Migraine without aura, not intractable, without status migrainosus: Secondary | ICD-10-CM

## 2014-08-26 MED ORDER — RIZATRIPTAN BENZOATE 5 MG PO TBDP: 5.0000 mg | ORAL_TABLET | ORAL | Status: DC | PRN

## 2014-08-26 MED ORDER — NORTRIPTYLINE HCL 10 MG PO CAPS: ORAL_CAPSULE | ORAL | Status: DC

## 2014-08-26 NOTE — Progress Notes (Signed)
PATIENT: Rebecca Shea DOB: 05/27/1970  HISTORICAL  Rebecca Shea is a 44 yo RH female referred by her primary care physician NP Julio Sicksurtis, Carol, Gyneologist for evaluation of headaches  She had a history of headaches in the past few years, initially it is episodic, she remember one severe episode of sudden onset right temporal area stabbing pain, with light noise sensitivity, lasting about 30 minutes  Over the past few years, she has 1 or 2 episode of severe pounding in headaches, rest of the time, she is dealing with almost constant mild to moderate left-sided neck pain, right retro-orbital supraorbital area pressure headaches, sometimes with light noise sensitivity, no nauseous.  She complains a lot of stress, mild difficulty sleeping, jaw tightness at nighttime   REVIEW OF SYSTEMS: Full 14 system review of systems performed and notable only for blurred vision, easy bruising, feeling cold, allergy, headaches, depression, anxiety  ALLERGIES: Allergies  Allergen Reactions  . Sulfonamide Derivatives     REACTION: told as  a child    HOME MEDICATIONS: Current Outpatient Prescriptions  Medication Sig Dispense Refill  . furosemide (LASIX) 20 MG tablet Take 1 tablet (20 mg total) by mouth daily. 30 tablet 5  . ibuprofen (ADVIL,MOTRIN) 200 MG tablet Take 200 mg by mouth every 6 (six) hours as needed.    . pantoprazole (PROTONIX) 20 MG tablet Take 20 mg by mouth daily.     PAST MEDICAL HISTORY: Past Medical History  Diagnosis Date  . Anxiety 12/26/2011  . Headache     PAST SURGICAL HISTORY: Past Surgical History  Procedure Laterality Date  . Bunionectomy Bilateral   . Elbow fracture surgery Left   . Wisdom tooth extraction      x 4    FAMILY HISTORY: Family History  Problem Relation Age of Onset  . Healthy Mother   . Healthy Father   . Healthy Maternal Grandmother   . Hypertension Maternal Grandfather   . Hypercholesterolemia Maternal Grandfather   . Diabetes  Maternal Grandfather   . Hypertension Paternal Grandmother   . Angina Paternal Grandmother   . Hypercholesterolemia Paternal Grandmother   . Transient ischemic attack Paternal Grandmother   . Transient ischemic attack Maternal Grandfather   . Heart attack Paternal Grandmother   . Parkinson's disease Paternal Grandfather   . Stroke Paternal Grandfather   . Heart attack Paternal Grandfather   . Prostate cancer Paternal Grandfather     SOCIAL HISTORY:  History   Social History  . Marital Status: Married    Spouse Name: N/A  . Number of Children: 0  . Years of Education: 16   Occupational History  . Operations Associate    Social History Main Topics  . Smoking status: Never Smoker   . Smokeless tobacco: Not on file  . Alcohol Use: 0.0 oz/week    0 Standard drinks or equivalent per week     Comment: One drink per day.  . Drug Use: No  . Sexual Activity: Not on file   Other Topics Concern  . Not on file   Social History Narrative   Lives at home - grandmother lives with her.   Right-handed.   2-3 cups caffeine per day.    PHYSICAL EXAM   Filed Vitals:   08/26/14 0824  BP: 141/91  Pulse: 74  Height: 5\' 1"  (1.549 m)  Weight: 203 lb (92.08 kg)    Not recorded      Body mass index is 38.38 kg/(m^2).  PHYSICAL  EXAMNIATION:  Gen: NAD, conversant, well nourised, obese, well groomed                     Cardiovascular: Regular rate rhythm, no peripheral edema, warm, nontender. Eyes: Conjunctivae clear without exudates or hemorrhage Neck: Supple, no carotid bruise. Pulmonary: Clear to auscultation bilaterally   NEUROLOGICAL EXAM:  MENTAL STATUS: Speech:    Speech is normal; fluent and spontaneous with normal comprehension.  Cognition:    The patient is oriented to person, place, and time;     recent and remote memory intact;     language fluent;     normal attention, concentration,     fund of knowledge.  CRANIAL NERVES: CN II: Visual fields are full to  confrontation. Fundoscopic exam is normal with sharp discs and no vascular changes. Venous pulsations are present bilaterally. Pupils are 4 mm and briskly reactive to light. Visual acuity is 20/20 bilaterally. CN III, IV, VI: extraocular movement are normal. No ptosis. CN V: Facial sensation is intact to pinprick in all 3 divisions bilaterally. Corneal responses are intact.  CN VII: Face is symmetric with normal eye closure and smile. CN VIII: Hearing is normal to rubbing fingers CN IX, X: Palate elevates symmetrically. Phonation is normal. CN XI: Head turning and shoulder shrug are intact CN XII: Tongue is midline with normal movements and no atrophy.  MOTOR: There is no pronator drift of out-stretched arms. Muscle bulk and tone are normal. Muscle strength is normal.   Shoulder abduction Shoulder external rotation Elbow flexion Elbow extension Wrist flexion Wrist extension Finger abduction Hip flexion Knee flexion Knee extension Ankle dorsi flexion Ankle plantar flexion  R L REFLEXES: Reflexes are 2+ and symmetric at the biceps, triceps, knees, and ankles. Plantar responses are flexor.  SENSORY: Light touch, pinprick, position sense, and vibration sense are intact in fingers and toes.  COORDINATION: Rapid alternating movements and fine finger movements are intact. There is no dysmetria on finger-to-nose and heel-knee-shin. There are no abnormal or extraneous movements.   GAIT/STANCE: Posture is normal. Gait is steady with normal steps, base, arm swing, and turning. Heel and toe walking are normal. Tandem gait is normal.  Romberg is absent.   DIAGNOSTIC DATA (LABS, IMAGING, TESTING) - I reviewed patient records, labs, notes, testing and imaging myself where available.  Lab Results  Component Value Date   WBC 7.9 02/11/2014   HGB 12.6 02/11/2014   HCT 36.9 02/11/2014   MCV 82.6 02/11/2014   PLT 325 02/11/2014      Component  Value Date/Time   NA 141 02/15/2014 1608   K 4.6 02/15/2014 1608   CL 104 02/15/2014 1608   CO2 25 02/15/2014 1608   GLUCOSE 87 02/15/2014 1608   BUN 12 02/15/2014 1608   CREATININE 0.56 02/15/2014 1608   CREATININE 0.51 05/25/2009 1434   CALCIUM 9.4 02/15/2014 1608   PROT 6.8 02/11/2014 1525   ALBUMIN 4.0 02/11/2014 1525   AST 14 02/11/2014 1525   ALT 12 02/11/2014 1525   ALKPHOS 70 02/11/2014 1525   BILITOT 0.5 02/11/2014 1525   GFRNONAA >89 02/15/2014 1608   GFRAA >89 02/15/2014 1608   Lab Results  Component Value Date   CHOL 161 09/30/2012   HDL 90 09/30/2012   LDLCALC 47 09/30/2012   TRIG 122 09/30/2012  CHOLHDL 1.8 09/30/2012   No results found for: HGBA1C Lab Results  Component Value Date   VITAMINB12 337 09/30/2012   Lab Results  Component Value Date   TSH 1.170 05/25/2009      ASSESSMENT AND PLAN  Rebecca Shea is a 44 y.o. female with history of migraine headaches, now presenting with frequent almost daily mild to moderate lateralized headache with migraine features, normal neurological examinations, also complains of depression anxiety, going through a lot of stress.   1, will start preventive medications, nortriptyline, 10 mg titrating to 20 mg every night as preventative medications 2. Maxalt as needed 3. Return to clinic in 2 months   Levert FeinsteinYijun Ramiya Delahunty, M.D. Ph.D.  [facility] [address], [address] [place], [place] [address] Ph: [phone] Fax: [phone]

## 2014-09-08 ENCOUNTER — Other Ambulatory Visit: Payer: Self-pay | Admitting: Family Medicine

## 2014-09-08 NOTE — Telephone Encounter (Signed)
Spoke with Pt regarding refill, states she has plenty of Lasix at home and only takes it PRN. Advised it has been a while since we have seen her other than for an acute visit, set up for routine f/u in a couple weeks. No further questions at this time.

## 2014-09-28 ENCOUNTER — Ambulatory Visit (INDEPENDENT_AMBULATORY_CARE_PROVIDER_SITE_OTHER): Payer: BLUE CROSS/BLUE SHIELD | Admitting: Family Medicine

## 2014-09-28 ENCOUNTER — Encounter: Payer: Self-pay | Admitting: Family Medicine

## 2014-09-28 VITALS — BP 123/85 | HR 92 | Wt 207.0 lb

## 2014-09-28 DIAGNOSIS — R609 Edema, unspecified: Secondary | ICD-10-CM | POA: Diagnosis not present

## 2014-09-28 DIAGNOSIS — L603 Nail dystrophy: Secondary | ICD-10-CM

## 2014-09-28 DIAGNOSIS — R358 Other polyuria: Secondary | ICD-10-CM | POA: Diagnosis not present

## 2014-09-28 DIAGNOSIS — E559 Vitamin D deficiency, unspecified: Secondary | ICD-10-CM | POA: Diagnosis not present

## 2014-09-28 DIAGNOSIS — R3589 Other polyuria: Secondary | ICD-10-CM

## 2014-09-28 MED ORDER — FUROSEMIDE 20 MG PO TABS
ORAL_TABLET | ORAL | Status: DC
Start: 2014-09-28 — End: 2014-10-14

## 2014-09-28 NOTE — Progress Notes (Signed)
CC: Rebecca Shea is a 44 y.o. female is here for Follow-up   Subjective: HPI:  Bilateral ankle edema that is symmetric and almost absent first in the morning. As the day progresses it seems to get worse and much she takes Lasix first thing in the morning. Symptoms seemed to return about 8 hours after taking Lasix if she is in an upright position. Starting to cause achiness in the swelling regions. Denies edema elsewhere. Denies orthopnea nor shortness of breath  Complains of brittle nails involving all 20 nails with frequent chipping. She's also had some unintentional weight gain and wants know if she can have her thyroid checked  Reports frequent urination even on the days that she does not take Lasix. She denies any other urinary complaints such as dysuria, urgency or hesitancy. Symptoms have been present for the majority of her adulthood and she is worried that they may be worsening and may be reflective of type 2 diabetes.   Review Of Systems Outlined In HPI  Past Medical History  Diagnosis Date  . Anxiety 12/26/2011  . Headache     Past Surgical History  Procedure Laterality Date  . Bunionectomy Bilateral   . Elbow fracture surgery Left   . Wisdom tooth extraction      x 4   Family History  Problem Relation Age of Onset  . Healthy Mother   . Healthy Father   . Healthy Maternal Grandmother   . Hypertension Maternal Grandfather   . Hypercholesterolemia Maternal Grandfather   . Diabetes Maternal Grandfather   . Hypertension Paternal Grandmother   . Angina Paternal Grandmother   . Hypercholesterolemia Paternal Grandmother   . Transient ischemic attack Paternal Grandmother   . Transient ischemic attack Maternal Grandfather   . Heart attack Paternal Grandmother   . Parkinson's disease Paternal Grandfather   . Stroke Paternal Grandfather   . Heart attack Paternal Grandfather   . Prostate cancer Paternal Grandfather     History   Social History  . Marital Status: Married     Spouse Name: N/A  . Number of Children: 0  . Years of Education: 16   Occupational History  . Operations Associate    Social History Main Topics  . Smoking status: Never Smoker   . Smokeless tobacco: Not on file  . Alcohol Use: 0.0 oz/week    0 Standard drinks or equivalent per week     Comment: One drink per day.  . Drug Use: No  . Sexual Activity: Not on file   Other Topics Concern  . Not on file   Social History Narrative   Lives at home - grandmother lives with her.   Right-handed.   2-3 cups caffeine per day.     Objective: BP 123/85 mmHg  Pulse 92  Wt 207 lb (93.895 kg)  General: Alert and Oriented, No Acute Distress HEENT: Pupils equal, round, reactive to light. Conjunctivae clear.  Moist mucous membranes Lungs: Clear to auscultation bilaterally, no wheezing/ronchi/rales.  Comfortable work of breathing. Good air movement. Cardiac: Regular rate and rhythm. Normal S1/S2.  No murmurs, rubs, nor gallops.   Extremities: 1+ pitting ankle and shin edema.  Strong peripheral pulses.  Mental Status: No depression, anxiety, nor agitation. Skin: Warm and dry. Mild to moderate chipping of the distal aspects of all fingernails  Assessment & Plan: Rebecca Shea was seen today for follow-up.  Diagnoses and all orders for this visit:  Edema Orders: -     furosemide (LASIX) 20 MG  tablet; One to twice a day as needed for leg swelling.  Brittle nails Orders: -     TSH  Vitamin D deficiency Orders: -     Vit D  25 hydroxy (rtn osteoporosis monitoring)  Polyuria Orders: -     HgB A1c   Edema: Uncontrolled chronic condition, discussed sodium restriction and elevation of extremities when resting. Also discussed that she can take an additional dose of Lasix 6 hours after her first dose as long as it is 6 hours prior to bed. Brittle nails: Seems reasonable to check a TSH Iron deficiency: She is requesting this be checked since were doing blood work today, seems  reasonable Polyuria: Rule out diabetes with A1c  Return if symptoms worsen or fail to improve.

## 2014-09-29 ENCOUNTER — Telehealth: Payer: Self-pay | Admitting: Family Medicine

## 2014-09-29 LAB — VITAMIN D 25 HYDROXY (VIT D DEFICIENCY, FRACTURES): VIT D 25 HYDROXY: 21 ng/mL — AB (ref 30–100)

## 2014-09-29 LAB — HEMOGLOBIN A1C
HEMOGLOBIN A1C: 5.3 % (ref <5.7)
Mean Plasma Glucose: 105 mg/dL (ref <117)

## 2014-09-29 LAB — TSH: TSH: 2.146 u[IU]/mL (ref 0.350–4.500)

## 2014-09-29 MED ORDER — VITAMIN D (ERGOCALCIFEROL) 1.25 MG (50000 UNIT) PO CAPS: 50000.0000 [IU] | ORAL_CAPSULE | ORAL | Status: DC

## 2014-09-29 NOTE — Telephone Encounter (Signed)
Rebecca Shea, Will you please let patient know that her Vitamin D level was deficient therefore I've sent a three month weekly supplement [facility] that I'd recommend she start.  Follow up in three months with Dr. Linford ArnoldMetheney (PCP)

## 2014-09-29 NOTE — Telephone Encounter (Signed)
Pt.notified

## 2014-10-13 ENCOUNTER — Encounter: Payer: Self-pay | Admitting: Family Medicine

## 2014-10-14 MED ORDER — TORSEMIDE 20 MG PO TABS: 20.0000 mg | ORAL_TABLET | Freq: Every day | ORAL | Status: DC | PRN

## 2014-10-26 ENCOUNTER — Telehealth: Payer: Self-pay

## 2014-10-26 DIAGNOSIS — R609 Edema, unspecified: Secondary | ICD-10-CM

## 2014-10-26 NOTE — Telephone Encounter (Signed)
Rebecca Shea complains of a worsening of bilateral foot and ankle swelling. She is taking the torsemide as prescribed by Dr Ivan AnchorsHommel. She wanted to know what would be the next step.

## 2014-10-27 ENCOUNTER — Encounter: Payer: Self-pay | Admitting: Family Medicine

## 2014-10-28 ENCOUNTER — Encounter: Payer: Self-pay | Admitting: Family Medicine

## 2014-10-28 NOTE — Telephone Encounter (Signed)
I have never seen this patient, routing to PCP. It sounds like she simply needs lower extremity compression hose, but impossible to make a diagnosis without seeing her legs. I'm happy to see her if she desires, I had a lot of open slots today on my schedule.

## 2014-10-28 NOTE — Telephone Encounter (Signed)
Spoke with pt & advised her to get CMP done.  She is really concerned with the swelling she's experiencing.  She saw Dr. Rexene EdisonH a few weeks ago for it.  I wanted to know if you could take a look at his note and also the 2 emails she's sent.  I routed the one from today to Dr. Karie Schwalbe but he hasn't had a chance to read it.  Pt wants to know if she needs an ultrasound or if she should see a vein specialist.  Please advise.

## 2014-10-28 NOTE — Telephone Encounter (Signed)
Please also read the previous email from yesterday in her chart.

## 2014-10-28 NOTE — Telephone Encounter (Signed)
Call pt: recommend compression stocking and lets check kidney and liver function with CMP.

## 2014-10-29 NOTE — Telephone Encounter (Signed)
Spoke with pt this morning and she stated that she has compression stockings and is trying to wear them more at night.  I advised her to wear them throughout the day since that is when she has the swelling & she said she would try.  She is going to call back later today to get put on your schedule. Also, she wanted me to ask you if she could take 1 tab of lasix in addition to the torsemide that Dr. Ivan AnchorsHommel put her on.  Please advise.

## 2014-10-29 NOTE — Telephone Encounter (Signed)
I agree. Wearing them during the day and leave thme off at night.  Don't mix the lasix and the toresimid.e Record how much fluid drinking daily for the next week. Also record weight daily and write it down.

## 2014-10-29 NOTE — Telephone Encounter (Addendum)
US is only helpful if looking for DVT and that would only be if a single leg was swelling.  Vein specialist wouldn't help either.  She needs to buy some compression stocking.  [facility] carries them.  Recommend a medium grade/pressure pair. Let get her back on his schedule sometime next week.

## 2014-11-01 NOTE — Telephone Encounter (Signed)
Pt notified of instructions

## 2014-11-03 ENCOUNTER — Ambulatory Visit (INDEPENDENT_AMBULATORY_CARE_PROVIDER_SITE_OTHER): Payer: BLUE CROSS/BLUE SHIELD

## 2014-11-03 ENCOUNTER — Encounter: Payer: Self-pay | Admitting: Family Medicine

## 2014-11-03 ENCOUNTER — Ambulatory Visit (INDEPENDENT_AMBULATORY_CARE_PROVIDER_SITE_OTHER): Payer: BLUE CROSS/BLUE SHIELD | Admitting: Family Medicine

## 2014-11-03 ENCOUNTER — Other Ambulatory Visit: Payer: Self-pay | Admitting: Family Medicine

## 2014-11-03 VITALS — BP 138/88 | HR 97 | Ht 61.0 in | Wt 206.0 lb

## 2014-11-03 DIAGNOSIS — L52 Erythema nodosum: Secondary | ICD-10-CM

## 2014-11-03 DIAGNOSIS — R600 Localized edema: Secondary | ICD-10-CM

## 2014-11-03 DIAGNOSIS — L219 Seborrheic dermatitis, unspecified: Secondary | ICD-10-CM | POA: Diagnosis not present

## 2014-11-03 DIAGNOSIS — R0602 Shortness of breath: Secondary | ICD-10-CM

## 2014-11-03 LAB — POCT URINALYSIS DIPSTICK
Bilirubin, UA: NEGATIVE
GLUCOSE UA: NEGATIVE
Ketones, UA: NEGATIVE
Leukocytes, UA: NEGATIVE
Nitrite, UA: NEGATIVE
PH UA: 6
Protein, UA: NEGATIVE
Spec Grav, UA: 1.01
Urobilinogen, UA: 0.2

## 2014-11-03 IMAGING — CR DG CHEST 2V
2 series · 2 of 2 positions shown · non-contrast
Comparison: [DATE]

CLINICAL DATA: Shortness of breath.  Swelling in the feet.

EXAM:
CHEST  2 VIEW

[chest pa]
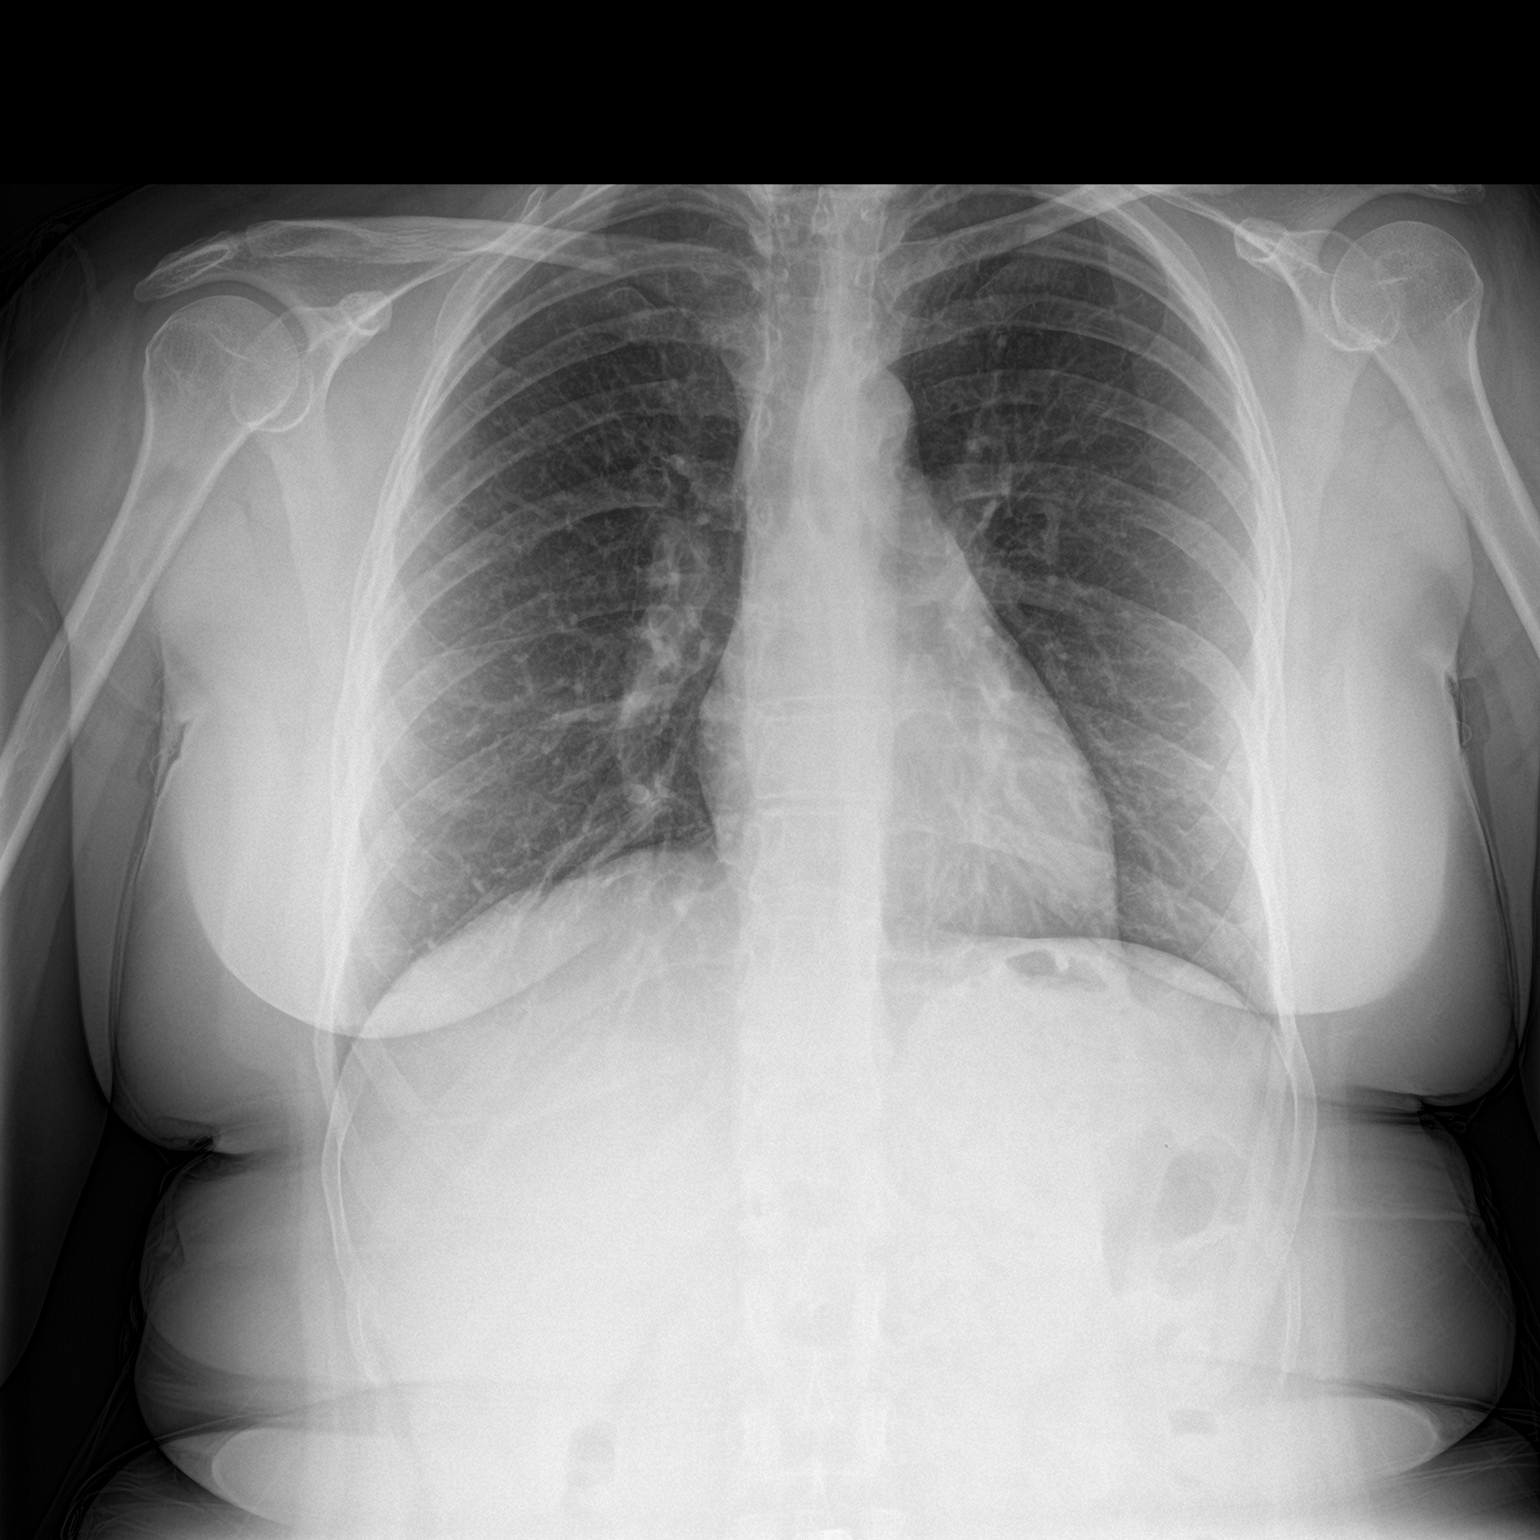

[chest lat]
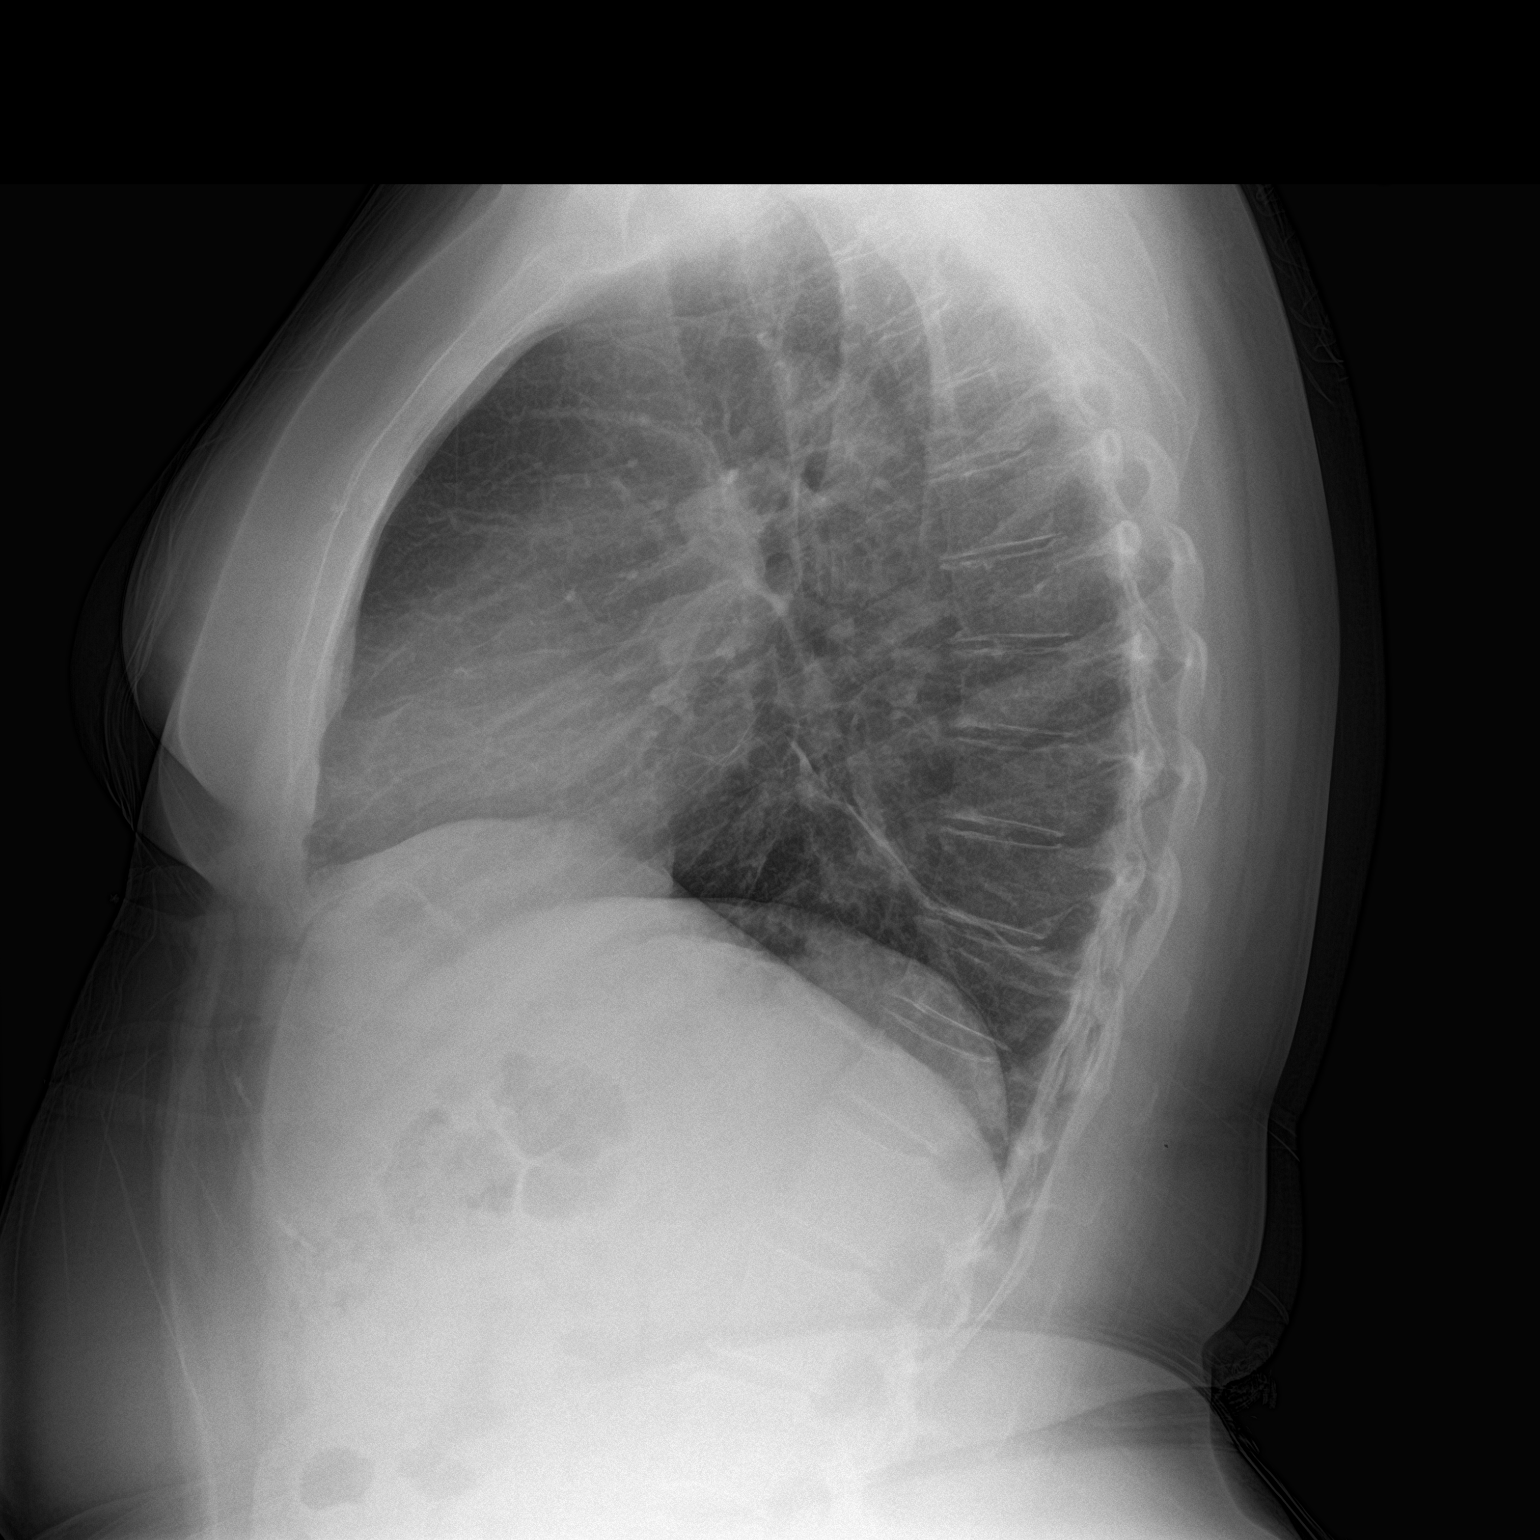

[2 of 2 positions shown; findings below may reference images not displayed]

FINDINGS: Heart size and pulmonary vascularity are normal and the lungs are
clear except for unchanged slight linear scarring at the right base.
No effusions. No osseous abnormality.
IMPRESSION: No acute abnormalities.

## 2014-11-03 MED ORDER — KETOCONAZOLE 2 % EX SHAM: 1.0000 "application " | MEDICATED_SHAMPOO | CUTANEOUS | Status: DC

## 2014-11-03 NOTE — Progress Notes (Signed)
Subjective:    Patient ID: Rebecca Shea, female    DOB: 10/18/1970, 44 y.o.   MRN: [id number]  HPI Here for LE edema. Had similar sxs in 2011 and again about a year ago. When she came in last fall she thought it was from bug bites. Says gets better with prednisone.  She came in about a month ago and saw one of my partners for the bilateral ankle edema. It seems progress as the day goes on. She was taking Lasix daily and it did not seem to be controlling her symptoms she was switched to Demadex. She feels like it's not been working very well. She has been recording her weight over the last couple days and they seem to go up and down. Literally 3 pounds in one day. Has felt more SOB. She was started on notriptiline recently. She had just increased her dose to 2 tabs right before swelling started.   Scalp is itchey and scaley. Wants to know what to try on it.  Occ gets bumps with scale that get crusty.    Review of Systems  BP 138/88 mmHg  Pulse 97  Ht 5\' 1"  (1.549 m)  Wt 206 lb (93.441 kg)  BMI 38.94 kg/m2    Allergies  Allergen Reactions  . Sulfonamide Derivatives     REACTION: told as  a child    Past Medical History  Diagnosis Date  . Anxiety 12/26/2011  . Headache     Past Surgical History  Procedure Laterality Date  . Bunionectomy Bilateral   . Elbow fracture surgery Left   . Wisdom tooth extraction      x 4    History   Social History  . Marital Status: Married    Spouse Name: N/A  . Number of Children: 0  . Years of Education: 16   Occupational History  . Operations Associate    Social History Main Topics  . Smoking status: Never Smoker   . Smokeless tobacco: Not on file  . Alcohol Use: 0.0 oz/week    0 Standard drinks or equivalent per week     Comment: One drink per day.  . Drug Use: No  . Sexual Activity: Not on file   Other Topics Concern  . Not on file   Social History Narrative   Lives at home - grandmother lives with her.   Right-handed.    2-3 cups caffeine per day.    Family History  Problem Relation Age of Onset  . Healthy Mother   . Healthy Father   . Healthy Maternal Grandmother   . Hypertension Maternal Grandfather   . Hypercholesterolemia Maternal Grandfather   . Diabetes Maternal Grandfather   . Hypertension Paternal Grandmother   . Angina Paternal Grandmother   . Hypercholesterolemia Paternal Grandmother   . Transient ischemic attack Paternal Grandmother   . Transient ischemic attack Maternal Grandfather   . Heart attack Paternal Grandmother   . Parkinson's disease Paternal Grandfather   . Stroke Paternal Grandfather   . Heart attack Paternal Grandfather   . Prostate cancer Paternal Grandfather     Outpatient Encounter Prescriptions as of 11/03/2014  Medication Sig  . ibuprofen (ADVIL,MOTRIN) 200 MG tablet Take 200 mg by mouth every 6 (six) hours as needed.  . nortriptyline (PAMELOR) 10 MG capsule One po qhs xone week, then 2 po qhs  . pantoprazole (PROTONIX) 20 MG tablet Take 20 mg by mouth daily.  . rizatriptan (MAXALT-MLT) 5 MG disintegrating tablet Take  1 tablet (5 mg total) by mouth as needed. May repeat in 2 hours if needed  . torsemide (DEMADEX) 20 MG tablet Take 1 tablet (20 mg total) by mouth daily as needed (swelling).  . Vitamin D, Ergocalciferol, (DRISDOL) 50000 UNITS CAPS capsule Take 1 capsule (50,000 Units total) by mouth every 7 (seven) days. Recheck Vitamin D in 3 Months  . ketoconazole (NIZORAL) 2 % shampoo Apply 1 application topically 2 (two) times a week.   No facility-administered encounter medications on file as of 11/03/2014.          Objective:   Physical Exam  Constitutional: She is oriented to person, place, and time. She appears well-developed and well-nourished.  HENT:  Head: Normocephalic and atraumatic.  Cardiovascular: Normal rate, regular rhythm and normal heart sounds.   Pulmonary/Chest: Effort normal and breath sounds normal.  Musculoskeletal: She exhibits edema.   1+ pitting edema up to the knees bilaterally. She has compression stocking on the left leg. She has 2+ edema around the right ankle. She also has a few scattered violet colored nodules over her shins  Neurological: She is alert and oriented to person, place, and time.  Skin: Skin is warm and dry.  Dry scaly scalp.   Psychiatric: She has a normal mood and affect. Her behavior is normal.          Assessment & Plan:  LE edema  - unclear etiology at this point but based on her exam I do think she may have some erythema nodosum as she has some bile-colored papules scattered over both legs. This can be caused by many factors. We'll start with some basic blood work including a CBC, sedimentation rate, CRP, ASO titer and a chest x-ray. Consider 5 day steroid taper. Interestingly she has had similar symptoms in the past and thought it was from bug bites and was given prednisone and actually had a great response to it. Urinalysis was negative for any protein today. I don't see any other signs of heart failure etc. on exam today. She is drinking too much fluid. She kept a log for [place]. Yesterday for example she had 109 ounces of fluid. Encouraged her to cut back to about 70 ounces per day specially with the diuretic. This may help control her fluid. Continue to wear the compression stockings.  Seborrheic dermatitis-we'll start treatment with topical every, Saul shampoo. If not improving over the next 6-8 weeks and consider a topical steroid foam for the scalp.

## 2014-11-03 NOTE — Patient Instructions (Signed)
Shoot for about 70 ounces of fluid a day.

## 2014-11-04 ENCOUNTER — Encounter: Payer: Self-pay | Admitting: Neurology

## 2014-11-04 ENCOUNTER — Ambulatory Visit (INDEPENDENT_AMBULATORY_CARE_PROVIDER_SITE_OTHER): Payer: BLUE CROSS/BLUE SHIELD | Admitting: Neurology

## 2014-11-04 VITALS — BP 123/84 | HR 83 | Ht 61.0 in | Wt 206.0 lb

## 2014-11-04 DIAGNOSIS — G43009 Migraine without aura, not intractable, without status migrainosus: Secondary | ICD-10-CM

## 2014-11-04 DIAGNOSIS — G4710 Hypersomnia, unspecified: Secondary | ICD-10-CM

## 2014-11-04 LAB — CBC WITH DIFFERENTIAL/PLATELET
BASOS ABS: 0 10*3/uL (ref 0.0–0.1)
Basophils Relative: 0 % (ref 0–1)
EOS ABS: 0.1 10*3/uL (ref 0.0–0.7)
Eosinophils Relative: 2 % (ref 0–5)
HCT: 35 % — ABNORMAL LOW (ref 36.0–46.0)
Hemoglobin: 11.9 g/dL — ABNORMAL LOW (ref 12.0–15.0)
LYMPHS PCT: 31 % (ref 12–46)
Lymphs Abs: 2.2 10*3/uL (ref 0.7–4.0)
MCH: 26.6 pg (ref 26.0–34.0)
MCHC: 34 g/dL (ref 30.0–36.0)
MCV: 78.1 fL (ref 78.0–100.0)
MPV: 9.5 fL (ref 8.6–12.4)
Monocytes Absolute: 0.4 10*3/uL (ref 0.1–1.0)
Monocytes Relative: 6 % (ref 3–12)
NEUTROS PCT: 61 % (ref 43–77)
Neutro Abs: 4.3 10*3/uL (ref 1.7–7.7)
Platelets: 278 10*3/uL (ref 150–400)
RBC: 4.48 MIL/uL (ref 3.87–5.11)
RDW: 15 % (ref 11.5–15.5)
WBC: 7 10*3/uL (ref 4.0–10.5)

## 2014-11-04 LAB — COMPLETE METABOLIC PANEL WITH GFR
ALT: 22 U/L (ref 0–35)
AST: 25 U/L (ref 0–37)
Albumin: 3.6 g/dL (ref 3.5–5.2)
Alkaline Phosphatase: 82 U/L (ref 39–117)
BILIRUBIN TOTAL: 0.4 mg/dL (ref 0.2–1.2)
BUN: 13 mg/dL (ref 6–23)
CO2: 23 mEq/L (ref 19–32)
Calcium: 9 mg/dL (ref 8.4–10.5)
Chloride: 104 mEq/L (ref 96–112)
Creat: 0.66 mg/dL (ref 0.50–1.10)
GLUCOSE: 94 mg/dL (ref 70–99)
Potassium: 3.8 mEq/L (ref 3.5–5.3)
Sodium: 139 mEq/L (ref 135–145)
Total Protein: 6.8 g/dL (ref 6.0–8.3)

## 2014-11-04 LAB — C-REACTIVE PROTEIN: CRP: 3.8 mg/dL — ABNORMAL HIGH (ref <0.60)

## 2014-11-04 LAB — TSH: TSH: 1.245 u[IU]/mL (ref 0.350–4.500)

## 2014-11-04 LAB — SEDIMENTATION RATE: Sed Rate: 21 mm/hr — ABNORMAL HIGH (ref 0–20)

## 2014-11-04 MED ORDER — TOPIRAMATE 25 MG PO TABS: ORAL_TABLET | ORAL | Status: DC

## 2014-11-04 MED ORDER — PREDNISONE 20 MG PO TABS: ORAL_TABLET | ORAL | Status: DC

## 2014-11-04 MED ORDER — PROPRANOLOL HCL ER 60 MG PO CP24: 60.0000 mg | ORAL_CAPSULE | Freq: Every day | ORAL | Status: DC

## 2014-11-04 NOTE — Progress Notes (Signed)
Chief Complaint  Patient presents with  . Migraine    She feels nortriptyline  at bedtime has done little to improve her migraines.  She has not tried Maxalt.  Reports still getting 1-2 migraines per week lasting 1-2 days each.  She has daily dull headaches.      PATIENT: Rebecca Shea DOB: [birth year]  HISTORICAL (Aug 26 2014)  Rebecca Shea is a 44 yo RH female referred by her primary care physician NP Julio Sicks, Gyneologist for evaluation of headaches  She had a history of headaches in the past few years, initially it is episodic, her typical migraines are sudden onset right temporal area severe headaches, stabbing pain, with light noise sensitivity, lasting about 30 minutes  Over the past few years, she has 1 or 2 episode of severe pounding headaches in one week, rest of the time, she is dealing with almost constant mild to moderate left-sided neck pain, right retro-orbital supraorbital area pressure headaches, sometimes with light noise sensitivity, no nauseous.  She complains a lot of stress, mild difficulty sleeping, jaw tightness at nighttime  She denies snoring, but often woke up with mild bifrontal pressure headaches, none refreshing sleep, fatigue, day time sleepiness  She has history of kidney stone, not good candidate for Topamax.  UPDATE November 04 2014: She still has migraine 1-2/week, lasting 8-12 hours, she never tried Maxalt, She is taking nortriptyline  qhs, seems to sleep better, but some night she still wokeup, early morning pressure headaches, dry mouth day time sleepiness, fatigue. Today's ESS score is 10, FSS score is 29  REVIEW OF SYSTEMS: Full 14 system review of systems performed and notable only for environmental allergies, bruise easily, headaches, leg swelling, daytime sleepiness  ALLERGIES: Allergies  Allergen Reactions  . Sulfonamide Derivatives     REACTION: told as  a child    HOME MEDICATIONS: Current Outpatient Prescriptions    Medication Sig Dispense Refill  . furosemide (LASIX) 20 MG tablet Take 1 tablet (20 mg total) by mouth daily. 30 tablet 5  . ibuprofen (ADVIL,MOTRIN) 200 MG tablet Take 200 mg by mouth every 6 (six) hours as needed.    . pantoprazole (PROTONIX) 20 MG tablet Take 20 mg by mouth daily.     PAST MEDICAL HISTORY: Past Medical History  Diagnosis Date  . Anxiety 12/26/2011  . Headache     PAST SURGICAL HISTORY: Past Surgical History  Procedure Laterality Date  . Bunionectomy Bilateral   . Elbow fracture surgery Left   . Wisdom tooth extraction      x 4    FAMILY HISTORY: Family History  Problem Relation Age of Onset  . Healthy Mother   . Healthy Father   . Healthy Maternal Grandmother   . Hypertension Maternal Grandfather   . Hypercholesterolemia Maternal Grandfather   . Diabetes Maternal Grandfather   . Hypertension Paternal Grandmother   . Angina Paternal Grandmother   . Hypercholesterolemia Paternal Grandmother   . Transient ischemic attack Paternal Grandmother   . Transient ischemic attack Maternal Grandfather   . Heart attack Paternal Grandmother   . Parkinson's disease Paternal Grandfather   . Stroke Paternal Grandfather   . Heart attack Paternal Grandfather   . Prostate cancer Paternal Grandfather     SOCIAL HISTORY:  History   Social History  . Marital Status: Married    Spouse Name: N/A  . Number of Children: 0  . Years of Education: 16   Occupational History  . Operations Associate  Social History Main Topics  . Smoking status: Never Smoker   . Smokeless tobacco: Not on file  . Alcohol Use: 0.0 oz/week    0 Standard drinks or equivalent per week     Comment: One drink per day.  . Drug Use: No  . Sexual Activity: Not on file   Other Topics Concern  . Not on file   Social History Narrative   Lives at home - grandmother lives with her.   Right-handed.   2-3 cups caffeine per day.    PHYSICAL EXAM   Filed Vitals:   11/04/14 0832  BP:  123/84  Pulse: 83  Height: 5\' 1"  (1.549 m)  Weight: 206 lb (93.441 kg)    Not recorded      Body mass index is 38.94 kg/(m^2).  PHYSICAL EXAMNIATION:  Gen: NAD, conversant, well nourised, obese, well groomed                     Cardiovascular: Regular rate rhythm, no peripheral edema, warm, nontender. Eyes: Conjunctivae clear without exudates or hemorrhage Neck: Supple, no carotid bruise. Pulmonary: Clear to auscultation bilaterally   NEUROLOGICAL EXAM:  MENTAL STATUS: Speech:    Speech is normal; fluent and spontaneous with normal comprehension.  Cognition:    The patient is oriented to person, place, and time;     recent and remote memory intact;     language fluent;     normal attention, concentration,     fund of knowledge.  CRANIAL NERVES: CN II: Visual fields are full to confrontation. Fundoscopic exam is normal with sharp discs and no vascular changes. Venous pulsations are present bilaterally. Pupils are 4 mm and briskly reactive to light. Visual acuity is 20/20 bilaterally. CN III, IV, VI: extraocular movement are normal. No ptosis. CN V: Facial sensation is intact to pinprick in all 3 divisions bilaterally. Corneal responses are intact.  CN VII: Face is symmetric with normal eye closure and smile. CN VIII: Hearing is normal to rubbing fingers CN IX, X: Palate elevates symmetrically. Phonation is normal. CN XI: Head turning and shoulder shrug are intact CN XII: Tongue is midline with normal movements and no atrophy. Narrow oropharyngeal  MOTOR: There is no pronator drift of out-stretched arms. Muscle bulk and tone are normal. Muscle strength is normal.   Shoulder abduction Shoulder external rotation Elbow flexion Elbow extension Wrist flexion Wrist extension Finger abduction Hip flexion Knee flexion Knee extension Ankle dorsi flexion Ankle plantar flexion  R 5 5 5 5 5 5 5 5 5 5 5 5   L 5 5 5 5 5 5 5 5 5 5 5 5     REFLEXES: Reflexes are 2+ and symmetric at the  biceps, triceps, knees, and ankles. Plantar responses are flexor.  SENSORY: Light touch, pinprick, position sense, and vibration sense are intact in fingers and toes.  COORDINATION: Rapid alternating movements and fine finger movements are intact. There is no dysmetria on finger-to-nose and heel-knee-shin. There are no abnormal or extraneous movements.   GAIT/STANCE: Posture is normal. Gait is steady with normal steps, base, arm swing, and turning. Heel and toe walking are normal. Tandem gait is normal.  Romberg is absent.   DIAGNOSTIC DATA (LABS, IMAGING, TESTING) - I reviewed patient records, labs, notes, testing and imaging myself where available.  Lab Results  Component Value Date   WBC 7.0 11/03/2014   HGB 11.9* 11/03/2014   HCT 35.0* 11/03/2014   MCV 78.1 11/03/2014   PLT 278  11/03/2014      Component Value Date/Time   NA 139 11/03/2014 1530   K 3.8 11/03/2014 1530   CL 104 11/03/2014 1530   CO2 23 11/03/2014 1530   GLUCOSE 94 11/03/2014 1530   BUN 13 11/03/2014 1530   CREATININE 0.66 11/03/2014 1530   CREATININE 0.51 05/25/2009 1434   CALCIUM 9.0 11/03/2014 1530   PROT 6.8 11/03/2014 1530   ALBUMIN 3.6 11/03/2014 1530   AST 25 11/03/2014 1530   ALT 22 11/03/2014 1530   ALKPHOS 82 11/03/2014 1530   BILITOT 0.4 11/03/2014 1530   GFRNONAA >89 11/03/2014 1530   GFRAA >89 11/03/2014 1530   Lab Results  Component Value Date   CHOL 161 09/30/2012   HDL 90 09/30/2012   LDLCALC 47 09/30/2012   TRIG 122 09/30/2012   CHOLHDL 1.8 09/30/2012   Lab Results  Component Value Date   HGBA1C 5.3 09/28/2014   Lab Results  Component Value Date   VITAMINB12 337 09/30/2012   Lab Results  Component Value Date   TSH 1.245 11/03/2014    ASSESSMENT AND PLAN  Rebecca Shea is a 44 y.o. female with history of migraine headaches, now presenting with frequent almost daily mild to moderate lateralized headache with migraine features, normal neurological examinations, also  complains of depression anxiety, going through a lot of stress.   1, chronic migraine, not a good candidate for Topamax because history of kidney stone, continue nortriptyline at 20 mg daily as preventive medications, Maxalt as needed 2, Obstructive sleep apnea, she has mild overweight, dry mouth, early-morning headache, excessive daytime sleepiness, ESS score is 10, will refer her to sleep study 3. Return to clinic in 3 months   Levert Feinstein, M.D. Ph.D.  [facility] [address], [address] [place], [place] [address] Ph: [phone] Fax: [phone]

## 2014-11-04 NOTE — Addendum Note (Signed)
Addended by: Nani GasserMETHENEY, CATHERINE D on: 11/04/2014 08:20 AM   Modules accepted: Orders

## 2014-11-05 LAB — FERRITIN: FERRITIN: 12 ng/mL (ref 10–291)

## 2014-11-06 LAB — ANTISTREPTOLYSIN O TITER: ASO: 118 IU/mL (ref <409)

## 2014-11-08 ENCOUNTER — Other Ambulatory Visit: Payer: Self-pay | Admitting: Family Medicine

## 2014-12-06 ENCOUNTER — Other Ambulatory Visit: Payer: Self-pay | Admitting: Family Medicine

## 2014-12-06 DIAGNOSIS — Z9189 Other specified personal risk factors, not elsewhere classified: Secondary | ICD-10-CM

## 2014-12-09 NOTE — Telephone Encounter (Signed)
Okay to fill prescription with one refill but she needs to come in for a BMP just to make sure that her electrolytes are normal. Please just verify with her whether not she is taking it as needed or if she is still taking it every day.

## 2014-12-14 ENCOUNTER — Encounter: Payer: Self-pay | Admitting: Neurology

## 2014-12-14 ENCOUNTER — Ambulatory Visit (INDEPENDENT_AMBULATORY_CARE_PROVIDER_SITE_OTHER): Payer: BLUE CROSS/BLUE SHIELD | Admitting: Neurology

## 2014-12-14 VITALS — BP 132/86 | HR 68 | Resp 16 | Ht 61.0 in | Wt 207.0 lb

## 2014-12-14 DIAGNOSIS — R519 Headache, unspecified: Secondary | ICD-10-CM

## 2014-12-14 DIAGNOSIS — R351 Nocturia: Secondary | ICD-10-CM

## 2014-12-14 DIAGNOSIS — R51 Headache: Secondary | ICD-10-CM

## 2014-12-14 DIAGNOSIS — R600 Localized edema: Secondary | ICD-10-CM

## 2014-12-14 DIAGNOSIS — R0683 Snoring: Secondary | ICD-10-CM | POA: Diagnosis not present

## 2014-12-14 DIAGNOSIS — G4719 Other hypersomnia: Secondary | ICD-10-CM | POA: Diagnosis not present

## 2014-12-14 NOTE — Progress Notes (Signed)
Subjective:    Patient ID: Rebecca Shea is a 44 y.o. female.  HPI     Huston Foley, MD, PhD [facility] [address], [address] [address] [place], [place] [address]  Dear Vivia Ewing,   I saw patient, Rebecca Shea, upon your kind request in my clinic today for initial consultation of her sleep disorder, in particular, concern for underlying obstructive sleep apnea. The patient is unaccompanied today. As you know, Rebecca Shea is a very pleasant 44 year old right-handed woman with an underlying medical history of anxiety, right hip pain, recurrent migraines, and obesity, who reports snoring, morning headaches, and excessive daytime somnolence. She describes her morning headaches as dull, bifrontal and achy, different from her migraines. Her ESS is 11/24 and her FSS is 41/63. She has no FHx of OSA. She has gained weight and this is heaviest she has ever been. She has had LE swelling for months. She denies frank RLS type symptoms and is not aware of any leg twitching at night.  I reviewed your office note from 11/04/14. The patient was given a Rx for inderal LA 60 mg, but has not started it yet. Side effects. She could not take Topamax because of history of kidney stones. She takes torsemide as needed for her lower extremity swelling. She has tried over-the-counter compression stockings for this but they are not very effective. She takes Protonix for reflux. She has occasional postnasal drip. She has nocturia every night, about 2-3 times. She frequently wakes up with headaches. She has a history of mild snoring which does get worse with congestion. She may open her mouth and mouth breathe at night. She has had vivid dreams. These became worse when she was on Zoloft in the past. She denies any parasomnias currently. She drinks caffeine in the form of coffee, 2 cups each morning and about 4 ounces of Coke in the afternoons. She is a nonsmoker. Her bedtime is around 11:30 and her rise time is  around 7 AM. She does not wake up rested typically.  Her Past Medical History Is Significant For: Past Medical History  Diagnosis Date  . Anxiety 12/26/2011  . Headache     Her Past Surgical History Is Significant For: Past Surgical History  Procedure Laterality Date  . Bunionectomy Bilateral   . Elbow fracture surgery Left   . Wisdom tooth extraction      x 4    Her Family History Is Significant For: Family History  Problem Relation Age of Onset  . Healthy Mother   . Healthy Father   . Healthy Maternal Grandmother   . Hypertension Maternal Grandfather   . Hypercholesterolemia Maternal Grandfather   . Diabetes Maternal Grandfather   . Hypertension Paternal Grandmother   . Angina Paternal Grandmother   . Hypercholesterolemia Paternal Grandmother   . Transient ischemic attack Paternal Grandmother   . Transient ischemic attack Maternal Grandfather   . Heart attack Paternal Grandmother   . Parkinson's disease Paternal Grandfather   . Stroke Paternal Grandfather   . Heart attack Paternal Grandfather   . Prostate cancer Paternal Grandfather     Her Social History Is Significant For: Social History   Social History  . Marital Status: Married    Spouse Name: N/A  . Number of Children: 0  . Years of Education: 16   Occupational History  . Operations Associate    Social History Main Topics  . Smoking status: Never Smoker   . Smokeless tobacco: None  .  Alcohol Use: 0.0 oz/week    0 Standard drinks or equivalent per week     Comment: One drink per day.  . Drug Use: No  . Sexual Activity: Not Asked   Other Topics Concern  . None   Social History Narrative   Lives at home - grandmother lives with her.   Right-handed.   2-3 cups caffeine per day.    Her Allergies Are:  Allergies  Allergen Reactions  . Sulfonamide Derivatives     REACTION: told as  a child  :   Her Current Medications Are:  Outpatient Encounter Prescriptions as of 12/14/2014  Medication Sig   . ibuprofen (ADVIL,MOTRIN) 200 MG tablet Take 200 mg by mouth every 6 (six) hours as needed.  Marland Kitchen ketoconazole (NIZORAL) 2 % shampoo Apply 1 application topically 2 (two) times a week.  . pantoprazole (PROTONIX) 40 MG tablet   . propranolol ER (INDERAL LA) 60 MG 24 hr capsule Take 1 capsule (60 mg total) by mouth daily.  . rizatriptan (MAXALT-MLT) 5 MG disintegrating tablet Take 1 tablet (5 mg total) by mouth as needed. May repeat in 2 hours if needed  . topiramate (TOPAMAX) 25 MG tablet   . torsemide (DEMADEX) 20 MG tablet TAKE 1 TABLET (20 MG TOTAL) BY MOUTH DAILY AS NEEDED (SWELLING).  . Vitamin D, Ergocalciferol, (DRISDOL) 50000 UNITS CAPS capsule Take 1 capsule (50,000 Units total) by mouth every 7 (seven) days. Recheck Vitamin D in 3 Months  . [DISCONTINUED] furosemide (LASIX) 20 MG tablet   . [DISCONTINUED] nortriptyline (PAMELOR) 10 MG capsule One po qhs xone week, then 2 po qhs  . [DISCONTINUED] pantoprazole (PROTONIX) 20 MG tablet Take 20 mg by mouth daily.  . [DISCONTINUED] predniSONE (DELTASONE) 20 MG tablet 2 tabs po QD x 5 days then 1 po QD x 5 days.   No facility-administered encounter medications on file as of 12/14/2014.  :  Review of Systems:  Out of a complete 14 point review of systems, all are reviewed and negative with the exception of these symptoms as listed below:   Review of Systems  Constitutional: Positive for fatigue.  HENT:       Facial swelling   Endocrine: Positive for polyuria.  Musculoskeletal:       Neck pain, stiffness  Neurological: Positive for headaches.       Sometimes has trouble falling asleep, Nocturia, trouble staying asleep, morning headaches, snoring, no witnessed apnea, wakes up feeling tired, daytime tiredness, falls asleep during the day.   Hematological: Bruises/bleeds easily.  Psychiatric/Behavioral:       Nervous, anxious    Objective:  Neurologic Exam  Physical Exam Physical Examination:   Filed Vitals:   12/14/14 0926  BP:  132/86  Pulse: 68  Resp: 16   General Examination: The patient is a very pleasant 44 y.o. female in no acute distress. She appears well-developed and well-nourished and very well groomed.   HEENT: Normocephalic, atraumatic, pupils are equal, round and reactive to light and accommodation. Funduscopic exam is normal with sharp disc margins noted. Extraocular tracking is good without limitation to gaze excursion or nystagmus noted. Normal smooth pursuit is noted. Hearing is grossly intact. Tympanic membranes are clear bilaterally. Face is symmetric with normal facial animation and normal facial sensation. Speech is clear with no dysarthria noted. There is no hypophonia. There is no lip, neck/head, jaw or voice tremor. Neck is supple with full range of passive and active motion. There are no carotid bruits on auscultation.  Oropharynx exam reveals: mild mouth dryness, good dental hygiene and moderate airway crowding, due to narrow airway entry, longer uvula, and tonsils of 1-2+, right more prominent. Mallampati is class II. Tongue protrudes centrally and palate elevates symmetrically. Neck size is 14.75 inches. She has a nearly absent overbite. Nasal inspection reveals no significant nasal mucosal bogginess or redness, but septal deviation.   Chest: Clear to auscultation without wheezing, rhonchi or crackles noted.  Heart: S1+S2+0, regular and normal without murmurs, rubs or gallops noted.   Abdomen: Soft, non-tender and non-distended with normal bowel sounds appreciated on auscultation.  Extremities: There is 2+ pitting edema in the distal lower extremities bilaterally. Pedal pulses are intact.  Skin: Warm and dry without trophic changes noted. There are no varicose veins.  Musculoskeletal: exam reveals no obvious joint deformities, tenderness or joint swelling or erythema.   Neurologically:  Mental status: The patient is awake, alert and oriented in all 4 spheres. Her immediate and remote memory,  attention, language skills and fund of knowledge are appropriate. There is no evidence of aphasia, agnosia, apraxia or anomia. Speech is clear with normal prosody and enunciation. Thought process is linear. Mood is normal and affect is normal.  Cranial nerves II - XII are as described above under HEENT exam. In addition: shoulder shrug is normal with equal shoulder height noted. Motor exam: Normal bulk, strength and tone is noted. There is no drift, tremor or rebound. Romberg is negative. Reflexes are 2+ throughout. Babinski: Toes are flexor bilaterally. Fine motor skills and coordination: intact with normal finger taps, normal hand movements, normal rapid alternating patting, normal foot taps and normal foot agility.  Cerebellar testing: No dysmetria or intention tremor on finger to nose testing. Heel to shin is unremarkable bilaterally. There is no truncal or gait ataxia.  Sensory exam: intact to light touch in the upper and lower extremities.  Gait, station and balance: She stands easily. No veering to one side is noted. No leaning to one side is noted. Posture is age-appropriate and stance is narrow based. Gait shows normal stride length and normal pace. No problems turning are noted. She turns en bloc.               Assessment and Plan:  In summary, Rebecca Shea is a very pleasant 44 y.o.-year old female with an underlying medical history of anxiety, right hip pain, recurrent migraines, and obesity, who reports snoring, morning headaches, and excessive daytime somnolence. In addition, she reports nocturia and has noted lower extremity edema. Her history and physical exam are concerning for obstructive sleep apnea (OSA). I had a long chat with the patient about my findings and the diagnosis of OSA, its prognosis and treatment options. We talked about medical treatments, surgical interventions and non-pharmacological approaches. I explained in particular the risks and ramifications of untreated  moderate to severe OSA, especially with respect to developing cardiovascular disease down the Road, including congestive heart failure, difficult to treat hypertension, cardiac arrhythmias, or stroke. Even type 2 diabetes has, in part, been linked to untreated OSA. Symptoms of untreated OSA include daytime sleepiness, memory problems, mood irritability and mood disorder such as depression and anxiety, lack of energy, as well as recurrent headaches, especially morning headaches. We talked about trying to maintain a healthy lifestyle in general, as well as the importance of weight control. I encouraged the patient to eat healthy, exercise daily and keep well hydrated, to keep a scheduled bedtime and wake time routine, to not skip any  meals and eat healthy snacks in between meals. I advised the patient not to drive when feeling sleepy. I recommended the following at this time: sleep study with potential positive airway pressure titration. (We will score hypopneas at 3% and split the sleep study into diagnostic and treatment portion, if the estimated. 2 hour AHI is >15/h).   I explained the sleep test procedure to the patient and also outlined possible surgical and non-surgical treatment options of OSA, including the use of a custom-made dental device (which would require a referral to a specialist dentist or oral surgeon), upper airway surgical options, such as pillar implants, radiofrequency surgery, tongue base surgery, and UPPP (which would involve a referral to an ENT surgeon). Rarely, jaw surgery such as mandibular advancement may be considered.  I also explained the CPAP treatment option to the patient, who indicated that she would be (reluctant, particularly due to concern with claustrophobia), but willing to try CPAP if the need arises. I explained the importance of being compliant with PAP treatment, not only for insurance purposes but primarily to improve Her symptoms, and for the patient's long term  health benefit, including to reduce Her cardiovascular risks. I answered all her questions today and the patient was in agreement. I would like to see her back after the sleep study is completed and encouraged her to call with any interim questions, concerns, problems or updates.   Thank you very much for allowing me to participate in the care of this nice patient. If I can be of any further assistance to you please do not hesitate to talk to me.  Sincerely,   Huston Foley, MD, PhD

## 2014-12-14 NOTE — Patient Instructions (Signed)

## 2014-12-27 ENCOUNTER — Other Ambulatory Visit: Payer: Self-pay | Admitting: Family Medicine

## 2015-02-02 ENCOUNTER — Ambulatory Visit: Payer: BLUE CROSS/BLUE SHIELD | Admitting: Neurology

## 2015-03-02 ENCOUNTER — Ambulatory Visit: Payer: BLUE CROSS/BLUE SHIELD | Admitting: Neurology

## 2015-11-28 ENCOUNTER — Ambulatory Visit (INDEPENDENT_AMBULATORY_CARE_PROVIDER_SITE_OTHER): Payer: BLUE CROSS/BLUE SHIELD

## 2015-11-28 ENCOUNTER — Ambulatory Visit (INDEPENDENT_AMBULATORY_CARE_PROVIDER_SITE_OTHER): Payer: BLUE CROSS/BLUE SHIELD | Admitting: Osteopathic Medicine

## 2015-11-28 ENCOUNTER — Encounter: Payer: Self-pay | Admitting: Osteopathic Medicine

## 2015-11-28 VITALS — BP 141/89 | HR 59 | Ht 62.0 in | Wt 208.0 lb

## 2015-11-28 DIAGNOSIS — S0031XA Abrasion of nose, initial encounter: Secondary | ICD-10-CM

## 2015-11-28 DIAGNOSIS — J3489 Other specified disorders of nose and nasal sinuses: Secondary | ICD-10-CM

## 2015-11-28 DIAGNOSIS — S0081XA Abrasion of other part of head, initial encounter: Secondary | ICD-10-CM | POA: Diagnosis not present

## 2015-11-28 DIAGNOSIS — S0993XA Unspecified injury of face, initial encounter: Secondary | ICD-10-CM

## 2015-11-28 DIAGNOSIS — W19XXXA Unspecified fall, initial encounter: Secondary | ICD-10-CM

## 2015-11-28 IMAGING — DX DG NASAL BONES 3+V
4 series · 4 of 4 positions shown · non-contrast
Comparison: None.

CLINICAL DATA: Fall.  Pain.  Bruising.

EXAM:
NASAL BONES - 3+ VIEW

[[person_name]]
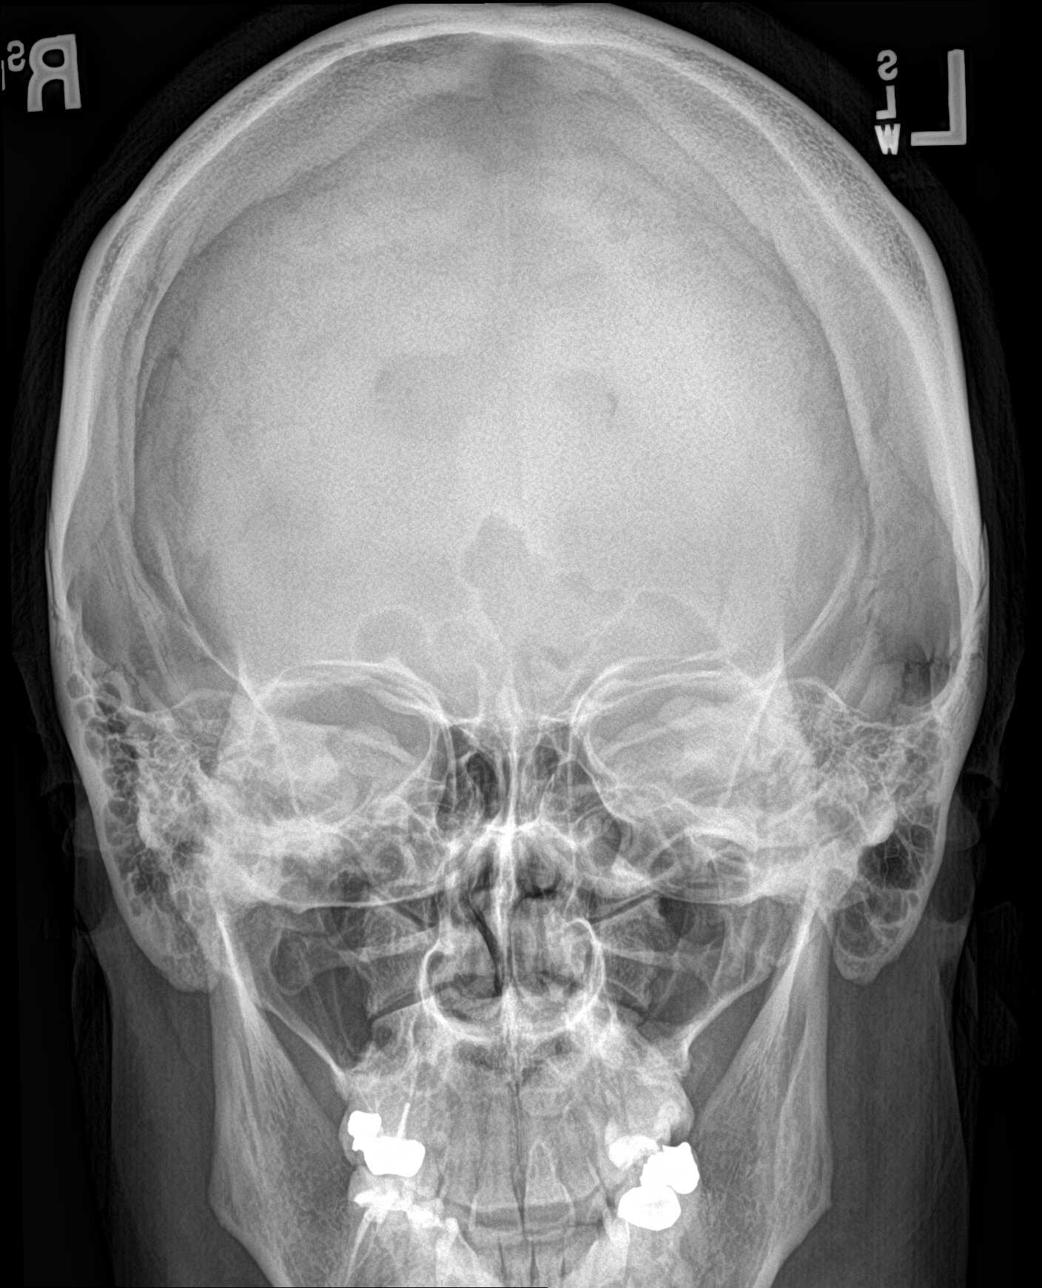

[nasal waters]
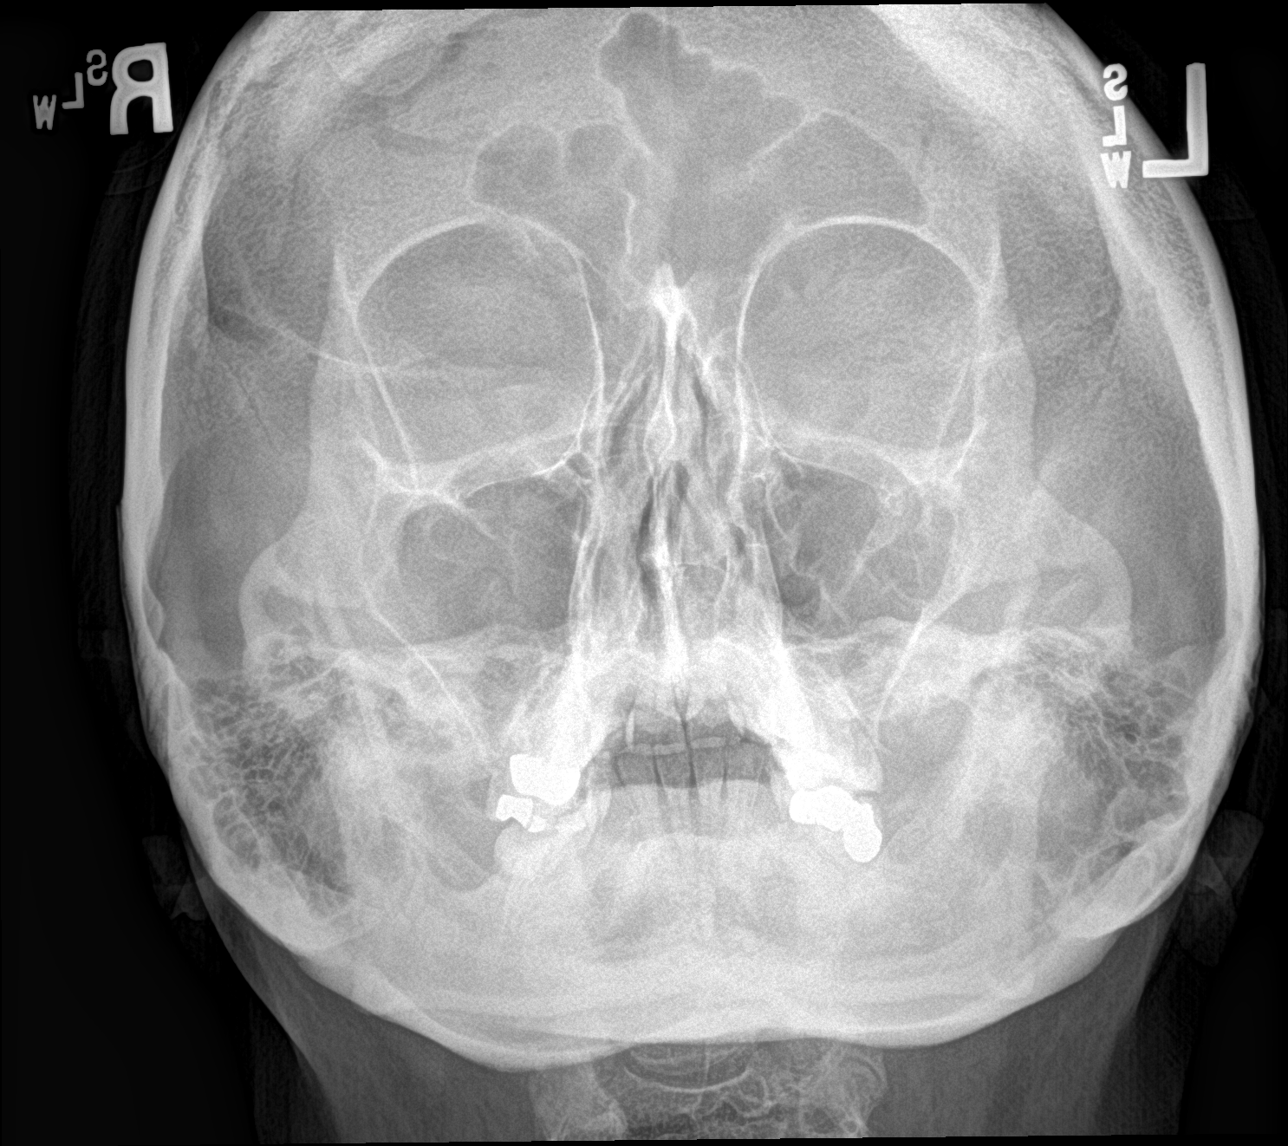

[nasal lat (1 of 2)]
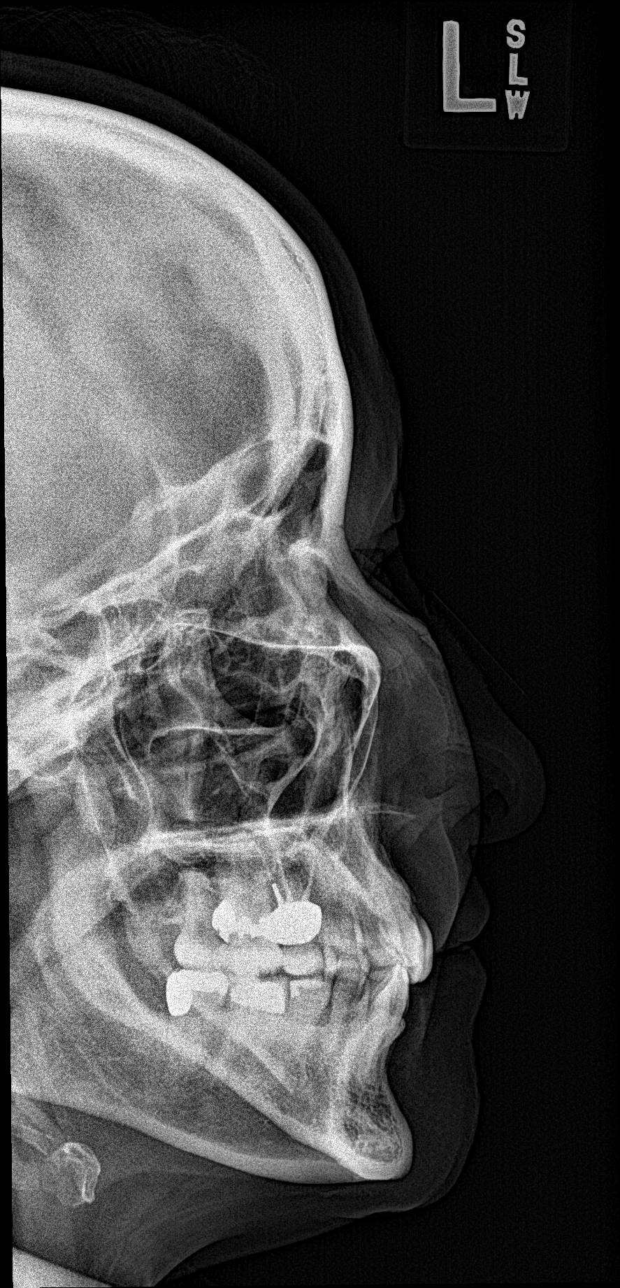

[nasal lat (2 of 2)]
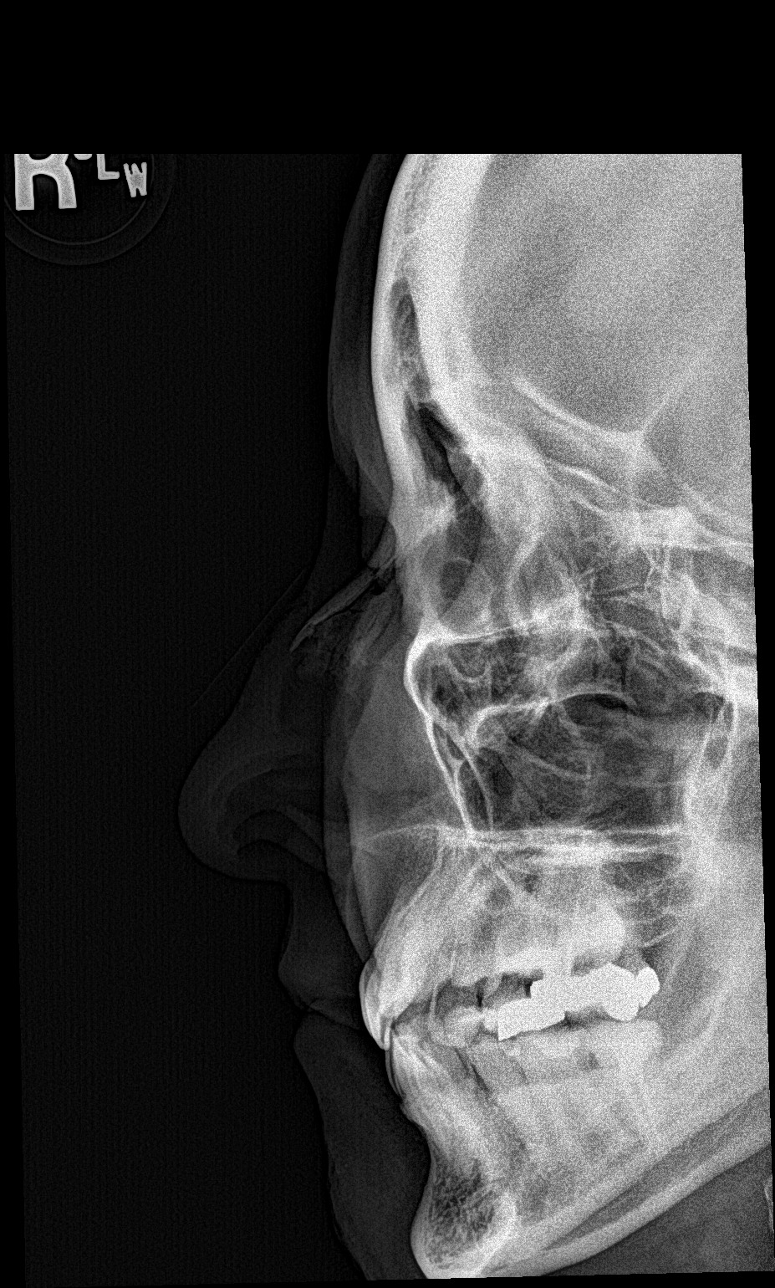

[4 of 4 positions shown; findings below may reference images not displayed]

FINDINGS: Nondisplaced fracture of the nasal bones cannot be completely
excluded. Maxillary spines appear to be intact . Paranasal sinuses
clear.
IMPRESSION: Nondisplaced fractures of the nasal bones cannot be completely
excluded. Exam is otherwise unremarkable .

## 2015-11-28 NOTE — Progress Notes (Signed)
HPI: Rebecca Shea is a 45 y.o.  female  who presents to [facility] today, 11/28/15,  for chief complaint of:  Chief Complaint  Patient presents with  . Facial Injury     . Context: Tripped and fell while running in the rain walking her dog, face landed flat on concrete, . Location/quality: Superficial abrasions on for head and left cheek, deep abrasion on bridge of nose . Assoc signs/symptoms:  did not hit corner, no dizziness or loss of consciousness. No vision changes.   Past medical, surgical, social and family history reviewed: Past Medical History:  Diagnosis Date  . Anxiety 12/26/2011  . Headache    Past Surgical History:  Procedure Laterality Date  . BUNIONECTOMY Bilateral   . ELBOW FRACTURE SURGERY Left   . WISDOM TOOTH EXTRACTION     x 4   Social History  Substance Use Topics  . Smoking status: Never Smoker  . Smokeless tobacco: Not on file  . Alcohol use 0.0 oz/week     Comment: One drink per day.   Family History  Problem Relation Age of Onset  . Healthy Mother   . Healthy Father   . Healthy Maternal Grandmother   . Hypertension Maternal Grandfather   . Hypercholesterolemia Maternal Grandfather   . Diabetes Maternal Grandfather   . Hypertension Paternal Grandmother   . Angina Paternal Grandmother   . Hypercholesterolemia Paternal Grandmother   . Transient ischemic attack Paternal Grandmother   . Transient ischemic attack Maternal Grandfather   . Heart attack Paternal Grandmother   . Parkinson's disease Paternal Grandfather   . Stroke Paternal Grandfather   . Heart attack Paternal Grandfather   . Prostate cancer Paternal Grandfather      Current medication list and allergy/intolerance information reviewed:   Current Outpatient Prescriptions  Medication Sig Dispense Refill  . ibuprofen (ADVIL,MOTRIN) 200 MG tablet Take 200 mg by mouth every 6 (six) hours as needed.    Marland Kitchen ketoconazole (NIZORAL) 2 % shampoo Apply 1  application topically 2 (two) times a week. 120 mL 2  . pantoprazole (PROTONIX) 40 MG tablet   1  . propranolol ER (INDERAL LA) 60 MG 24 hr capsule Take 1 capsule (60 mg total) by mouth daily. 30 capsule 6  . rizatriptan (MAXALT-MLT) 5 MG disintegrating tablet Take 1 tablet (5 mg total) by mouth as needed. May repeat in 2 hours if needed 15 tablet 6  . topiramate (TOPAMAX) 25 MG tablet   2  . torsemide (DEMADEX) 20 MG tablet TAKE 1 TABLET (20 MG TOTAL) BY MOUTH DAILY AS NEEDED (SWELLING). 30 tablet 1  . Vitamin D, Ergocalciferol, (DRISDOL) 50000 UNITS CAPS capsule Take 1 capsule (50,000 Units total) by mouth every 7 (seven) days. Recheck Vitamin D in 3 Months 12 capsule 0   No current facility-administered medications for this visit.    Allergies  Allergen Reactions  . Sulfonamide Derivatives     REACTION: told as  a child      Review of Systems:  Constitutional:  No  fever, no chills, No recent illness,   HEENT: No  headache, no vision change, no hearing change, Does not appear/feel displaced from normal, can breathe through both nares  Cardiac: No  chest pain, No  pressure,  Respiratory:  No  shortness of breath.   Skin: Abrasions as noted in history of present illness, otherwise No  Rash, No other wounds/concerning lesions  Neurologic: No  weakness, No  Dizziness, no vision  change   Exam:  BP (!) 141/89   Pulse (!) 59   Ht 5\' 2"  (1.575 m)   Wt 208 lb (94.3 kg)   BMI 38.04 kg/m   Constitutional: VS see above. General Appearance: alert, well-developed, well-nourished, NAD  Eyes: Normal lids and conjunctive, non-icteric sclera, EOMI, orbits nonpainful  Ears, Nose, Mouth, Throat: MMM, Normal external inspection ears/nares/mouth/lips/gums.. Pharynx/tonsils no erythema, no exudate. Nasal mucosa normal, no septal deviation or nasal hematoma, left side nasal passages seem a bit narrower, patient states that this is normal for her. Bridge of nose is tender, tenderness extending  laterally on both sides but not into the orbit  Neck: No masses, trachea midline. No thyroid enlargement. No tenderness/mass appreciated. No lymphadenopathy, normal range of motion to neck  Respiratory: Normal respiratory effort.   Musculoskeletal: Gait normal.   Neurological: No cranial nerve deficit on limited exam. Motor and sensation intact and symmetric. Cerebellar reflexes intact. Normal balance/coordination. No tremor.   Skin: warm, dry, superficial abrasions on for head and left cheek, deep abrasion on bridge of nose granulation tissue present. No rash/ulcer. No hematoma or ecchymoses   Psychiatric: Normal judgment/insight. Normal mood and affect. Oriented x3.      ASSESSMENT/PLAN:   Superficial abrasions without significant tenderness/hematoma, low suspicion for facial fracture, however given the mild extension of some pain in the nose will go ahead and get nasal x-ray and consider ENT referral if there is a fracture. Patient did not have any loss of consciousness/dizziness or any concerning neurologic symptoms or concussion symptoms. Educated on basic wound care, avoid peroxide, wet dressings are probably fine, patient did have some questions about something she brought him is on to cover up the abrasions can also wear makeup over these, I advised as long as it's not an adhesive that is sticking to the wound, since like this would probably be okay but would recommend breathable nonstick dressing first  Facial injury, initial encounter - Plan: DG Nasal Bones  Painful nose - Plan: DG Nasal Bones  Abrasion of nose, initial encounter  Abrasion of forehead, initial encounter  Abrasion of cheek, initial encounter     Visit summary with medication list and pertinent instructions was printed for patient to review. All questions at time of visit were answered - patient instructed to contact office with any additional concerns. ER/RTC precautions were reviewed with the patient.  Follow-up plan: Return if symptoms worsen or fail to improve, and as directed by PCP for routine care.

## 2015-11-29 ENCOUNTER — Encounter: Payer: Self-pay | Admitting: Osteopathic Medicine

## 2016-09-05 ENCOUNTER — Other Ambulatory Visit: Payer: Self-pay | Admitting: Internal Medicine

## 2017-04-10 ENCOUNTER — Encounter: Payer: Self-pay | Admitting: *Deleted

## 2017-04-10 ENCOUNTER — Emergency Department
Admission: EM | Admit: 2017-04-10 | Discharge: 2017-04-10 | Disposition: A | Discharge status: Home / Self Care | Payer: BLUE CROSS/BLUE SHIELD | Source: Home / Self Care | Attending: Family Medicine | Admitting: Family Medicine

## 2017-04-10 ENCOUNTER — Other Ambulatory Visit: Payer: Self-pay

## 2017-04-10 DIAGNOSIS — B9789 Other viral agents as the cause of diseases classified elsewhere: Secondary | ICD-10-CM | POA: Diagnosis not present

## 2017-04-10 DIAGNOSIS — J069 Acute upper respiratory infection, unspecified: Secondary | ICD-10-CM | POA: Diagnosis not present

## 2017-04-10 DIAGNOSIS — H6122 Impacted cerumen, left ear: Secondary | ICD-10-CM

## 2017-04-10 MED ORDER — METRONIDAZOLE 500 MG PO TABS: ORAL_TABLET | ORAL | 0 refills | Status: DC

## 2017-04-10 MED ORDER — BENZONATATE 200 MG PO CAPS: ORAL_CAPSULE | ORAL | 0 refills | Status: DC

## 2017-04-10 MED ORDER — FLUCONAZOLE 150 MG PO TABS: ORAL_TABLET | ORAL | 0 refills | Status: DC

## 2017-04-10 MED ORDER — AMOXICILLIN 875 MG PO TABS: 875.0000 mg | ORAL_TABLET | Freq: Two times a day (BID) | ORAL | 0 refills | Status: DC

## 2017-04-10 MED ORDER — PREDNISONE 20 MG PO TABS: ORAL_TABLET | ORAL | 0 refills | Status: DC

## 2017-04-10 NOTE — ED Provider Notes (Signed)
[facility]    CSN: [id number] Arrival date & time: 04/10/17  1700     History   Chief Complaint Chief Complaint  Patient presents with  . Cough    HPI Rebecca Shea is a 46 y.o. female.   Patient complains of awakening about 4 days ago with sinus congestion, migraine headache, and fatigue.  Yesterday she became hoarse, and last night developed a productive cough.  This morning she developed right earache.  No fevers, chills, and sweats.   The history is provided by the patient.    Past Medical History:  Diagnosis Date  . Anxiety 12/26/2011  . Headache     Patient Active Problem List   Diagnosis Date Noted  . Hematuria 02/15/2014  . B12 deficiency 12/26/2011  . Anxiety 12/26/2011  . Depression 12/26/2011  . UNSPECIFIED VITAMIN D DEFICIENCY 09/13/2009    Past Surgical History:  Procedure Laterality Date  . BUNIONECTOMY Bilateral   . ELBOW FRACTURE SURGERY Left   . WISDOM TOOTH EXTRACTION     x 4    OB History    No data available       Home Medications    Prior to Admission medications   Medication Sig Start Date End Date Taking? Authorizing Provider  amoxicillin (AMOXIL) 875 MG tablet Take 1 tablet (875 mg total) by mouth 2 (two) times daily. (Rx void after 04/18/17) 04/10/17   Lattie Haw, MD  benzonatate (TESSALON) 200 MG capsule Take one cap by mouth at bedtime as needed for cough.  May repeat in 4 to 6 hours 04/10/17   Lattie Haw, MD  ibuprofen (ADVIL,MOTRIN) 200 MG tablet Take 200 mg by mouth every 6 (six) hours as needed.    [provider]  ketoconazole (NIZORAL) 2 % shampoo Apply 1 application topically 2 (two) times a week. 11/03/14   Agapito Games, MD  pantoprazole (PROTONIX) 40 MG tablet  11/17/14   [provider]  predniSONE (DELTASONE) 20 MG tablet Take one tab by mouth twice daily for 4 days, then one daily. Take with food. 04/10/17   Lattie Haw, MD  Vitamin D, Ergocalciferol, (DRISDOL)  50000 UNITS CAPS capsule Take 1 capsule (50,000 Units total) by mouth every 7 (seven) days. Recheck Vitamin D in 3 Months 09/29/14   Laren Boom, DO    Family History Family History  Problem Relation Age of Onset  . Healthy Mother   . Healthy Father   . Healthy Maternal Grandmother   . Hypertension Maternal Grandfather   . Hypercholesterolemia Maternal Grandfather   . Diabetes Maternal Grandfather   . Transient ischemic attack Maternal Grandfather   . Hypertension Paternal Grandmother   . Angina Paternal Grandmother   . Hypercholesterolemia Paternal Grandmother   . Transient ischemic attack Paternal Grandmother   . Heart attack Paternal Grandmother   . Parkinson's disease Paternal Grandfather   . Stroke Paternal Grandfather   . Heart attack Paternal Grandfather   . Prostate cancer Paternal Grandfather     Social History Social History   Tobacco Use  . Smoking status: Never Smoker  . Smokeless tobacco: Never Used  Substance Use Topics  . Alcohol use: Yes    Alcohol/week: 0.0 oz    Comment: One drink per day.  . Drug use: No     Allergies   Sulfonamide derivatives   Review of Systems Review of Systems  + sore throat + cough No pleuritic pain No wheezing + nasal congestion + post-nasal  drainage No sinus pain/pressure No itchy/red eyes + right earache No hemoptysis No SOB No fever/chills + nausea No vomiting No abdominal pain No diarrhea No urinary symptoms No skin rash + fatigue No myalgias No headache Used OTC meds without relief    Physical Exam Triage Vital Signs ED Triage Vitals  Enc Vitals Group     BP      Pulse      Resp      Temp      Temp src      SpO2      Weight      Height      Head Circumference      Peak Flow      Pain Score      Pain Loc      Pain Edu?      Excl. in GC?    No data found.  Updated Vital Signs BP (!) 152/87 (BP Location: Left Arm)   Pulse 74   Temp 98.3 F (36.8 C) (Oral)   Resp 18   Ht 5\' 2"   (1.575 m)   Wt 210 lb (95.3 kg)   LMP 03/16/2017   SpO2 100%   BMI 38.41 kg/m   Visual Acuity Right Eye Distance:   Left Eye Distance:   Bilateral Distance:    Right Eye Near:   Left Eye Near:    Bilateral Near:     Physical Exam Nursing notes and Vital Signs reviewed. Appearance:  Patient appears stated age, and in no acute distress Eyes:  Pupils are equal, round, and reactive to light and accomodation.  Extraocular movement is intact.  Conjunctivae are not inflamed  Ears:   Right canal and tympanic membrane appear normal.  Left canal occluded with cerumen; post lavage the left tympanic membrane appears normal. Nose:  Mildly congested turbinates.  No sinus tenderness.   Pharynx:  Normal Neck:  Supple.  Enlarged posterior/lateral nodes are palpated bilaterally, tender to palpation on the left.   Lungs:  Clear to auscultation.  Breath sounds are equal.  Moving air well. Heart:  Regular rate and rhythm without murmurs, rubs, or gallops.  Abdomen:  Nontender without masses or hepatosplenomegaly.  Bowel sounds are present.  No CVA or flank tenderness.  Extremities:  No edema.  Skin:  No rash present.    UC Treatments / Results  Labs (all labs ordered are listed, but only abnormal results are displayed) Labs Reviewed -   Tympanometry:  Right ear tympanogram normal; Left ear tympanogram normal.  EKG  EKG Interpretation None       Radiology No results found.  Procedures Procedures  Left ear lavage by nurse.  Medications Ordered in UC Medications - No data to display   Initial Impression / Assessment and Plan / UC Course  I have reviewed the triage vital signs and the nursing notes.  Pertinent labs & imaging results that were available during my care of the patient were reviewed by me and considered in my medical decision making (see chart for details).    There is no evidence of bacterial infection today.   Begin prednisone burst/taper.  Prescription written for  Benzonatate [facility](Tessalon) to take at bedtime for night-time cough.  Take plain guaifenesin (1200mg  extended release tabs such as Mucinex) twice daily, with plenty of water, for cough and congestion.  Get adequate rest.   May use Afrin nasal spray (or generic oxymetazoline) each morning for about 5 days and then discontinue.  Also recommend using  saline nasal spray several times daily and saline nasal irrigation (AYR is a common brand).  Use Flonase nasal spray each morning after using Afrin nasal spray and saline nasal irrigation. Try warm salt water gargles for sore throat.  Stop all antihistamines for now, and other non-prescription cough/cold preparations. Begin Amoxicillin if not improving about one week or if persistent fever develops (Given a prescription to hold, with an expiration date)  Follow-up with family doctor if not improving about 10 days.     Final Clinical Impressions(s) / UC Diagnoses   Final diagnoses:  Viral URI with cough  Impacted cerumen of right ear    ED Discharge Orders        Ordered        04/10/17 1721      04/10/17 1721    predniSONE (DELTASONE) 20 MG tablet     04/10/17 1804    benzonatate (TESSALON) 200 MG capsule     04/10/17 1804    amoxicillin (AMOXIL) 875 MG tablet  2 times daily     04/10/17 1805         Lattie HawBeese, Devontaye Ground A, MD 04/11/17 2136

## 2017-04-10 NOTE — ED Triage Notes (Signed)
Pt c/o laryngitis and fatigue x 4 days with productive cough x last night. She has taken Nyquil, Dayquil, Aleve and Sudafed without relief.

## 2017-04-10 NOTE — Discharge Instructions (Addendum)
Take plain guaifenesin (1200mg extended release tabs such as Mucinex) twice daily, with plenty of water, for cough and congestion. Get adequate rest.   °May use Afrin nasal spray (or generic oxymetazoline) each morning for about 5 days and then discontinue.  Also recommend using saline nasal spray several times daily and saline nasal irrigation (AYR is a common brand).  Use Flonase nasal spray each morning after using Afrin nasal spray and saline nasal irrigation. °Try warm salt water gargles for sore throat.  °Stop all antihistamines for now, and other non-prescription cough/cold preparations. °Begin Amoxicillin if not improving about one week or if persistent fever develops   °Follow-up with family doctor if not improving about10 days.  °

## 2017-06-25 ENCOUNTER — Emergency Department
Admission: EM | Admit: 2017-06-25 | Discharge: 2017-06-25 | Disposition: A | Discharge status: Home / Self Care | Payer: BLUE CROSS/BLUE SHIELD | Source: Home / Self Care

## 2017-06-25 ENCOUNTER — Other Ambulatory Visit: Payer: Self-pay

## 2017-06-25 DIAGNOSIS — J0120 Acute ethmoidal sinusitis, unspecified: Secondary | ICD-10-CM | POA: Diagnosis not present

## 2017-06-25 DIAGNOSIS — R319 Hematuria, unspecified: Secondary | ICD-10-CM

## 2017-06-25 LAB — POCT URINALYSIS DIP (MANUAL ENTRY)
Bilirubin, UA: NEGATIVE
GLUCOSE UA: NEGATIVE mg/dL
NITRITE UA: NEGATIVE
Protein Ur, POC: NEGATIVE mg/dL
Spec Grav, UA: 1.01 (ref 1.010–1.025)
UROBILINOGEN UA: 0.2 U/dL
pH, UA: 5.5 (ref 5.0–8.0)

## 2017-06-25 MED ORDER — AMOXICILLIN-POT CLAVULANATE 875-125 MG PO TABS: 1.0000 | ORAL_TABLET | Freq: Two times a day (BID) | ORAL | 0 refills | Status: DC

## 2017-06-25 NOTE — Discharge Instructions (Signed)
Follow up with Dr. Linford ArnoldMetheney for recheck

## 2017-06-25 NOTE — ED Triage Notes (Signed)
Has been going on for over 3 weeks.  Drainage, nasal pressure. Stated that urine has been darker the last several weeks.

## 2017-06-25 NOTE — ED Provider Notes (Signed)
[facility]    CSN: [id number] Arrival date & time: 06/25/17  1648     History   Chief Complaint Chief Complaint  Patient presents with  . Nasal Congestion  . Facial Pain  . Fatigue    HPI Rebecca Shea is a 47 y.o. female.   The history is provided by the patient. No language interpreter was used.  Cough  Cough characteristics:  Non-productive Sputum characteristics:  Nondescript Severity:  Moderate Onset quality:  Gradual Timing:  Constant Progression:  Worsening Chronicity:  New Smoker: no   Context: not upper respiratory infection   Relieved by:  Nothing Worsened by:  Nothing Ineffective treatments:  None tried Associated symptoms: headaches and sinus congestion   Associated symptoms: no fever   Risk factors: no recent infection     Past Medical History:  Diagnosis Date  . Anxiety 12/26/2011  . Headache     Patient Active Problem List   Diagnosis Date Noted  . Hematuria 02/15/2014  . B12 deficiency 12/26/2011  . Anxiety 12/26/2011  . Depression 12/26/2011  . UNSPECIFIED VITAMIN D DEFICIENCY 09/13/2009    Past Surgical History:  Procedure Laterality Date  . BUNIONECTOMY Bilateral   . ELBOW FRACTURE SURGERY Left   . WISDOM TOOTH EXTRACTION     x 4    OB History    No data available       Home Medications    Prior to Admission medications   Medication Sig Start Date End Date Taking? Authorizing Provider  amoxicillin (AMOXIL) 875 MG tablet Take 1 tablet (875 mg total) by mouth 2 (two) times daily. (Rx void after 04/18/17) 04/10/17   Lattie HawBeese, Stephen A, MD  amoxicillin-clavulanate (AUGMENTIN) 875-125 MG tablet Take 1 tablet by mouth every 12 (twelve) hours. 06/25/17   Elson AreasSofia, Leslie K, PA-C  benzonatate (TESSALON) 200 MG capsule Take one cap by mouth at bedtime as needed for cough.  May repeat in 4 to 6 hours 04/10/17   Lattie HawBeese, Stephen A, MD  ibuprofen (ADVIL,MOTRIN) 200 MG tablet Take 200 mg by mouth every 6 (six) hours as needed.     [provider]  ketoconazole (NIZORAL) 2 % shampoo Apply 1 application topically 2 (two) times a week. 11/03/14   Agapito GamesMetheney, Catherine D, MD  pantoprazole (PROTONIX) 40 MG tablet  11/17/14   [provider]  predniSONE (DELTASONE) 20 MG tablet Take one tab by mouth twice daily for 4 days, then one daily. Take with food. 04/10/17   Lattie HawBeese, Stephen A, MD  Vitamin D, Ergocalciferol, (DRISDOL) 50000 UNITS CAPS capsule Take 1 capsule (50,000 Units total) by mouth every 7 (seven) days. Recheck Vitamin D in 3 Months 09/29/14   Laren BoomHommel, Sean, DO    Family History Family History  Problem Relation Age of Onset  . Healthy Mother   . Healthy Father   . Healthy Maternal Grandmother   . Hypertension Maternal Grandfather   . Hypercholesterolemia Maternal Grandfather   . Diabetes Maternal Grandfather   . Transient ischemic attack Maternal Grandfather   . Hypertension Paternal Grandmother   . Angina Paternal Grandmother   . Hypercholesterolemia Paternal Grandmother   . Transient ischemic attack Paternal Grandmother   . Heart attack Paternal Grandmother   . Parkinson's disease Paternal Grandfather   . Stroke Paternal Grandfather   . Heart attack Paternal Grandfather   . Prostate cancer Paternal Grandfather     Social History Social History   Tobacco Use  . Smoking status: Never Smoker  . Smokeless  tobacco: Never Used  Substance Use Topics  . Alcohol use: Yes    Alcohol/week: 0.0 oz    Comment: One drink per day.  . Drug use: No     Allergies   Sulfonamide derivatives   Review of Systems Review of Systems  Constitutional: Negative for fever.  Respiratory: Positive for cough.   Neurological: Positive for headaches.  All other systems reviewed and are negative.    Physical Exam Triage Vital Signs ED Triage Vitals  Enc Vitals Group     BP 06/25/17 1738 139/85     Pulse Rate 06/25/17 1738 79     Resp --      Temp 06/25/17 1738 98 F (36.7 C)     Temp Source  06/25/17 1738 Oral     SpO2 06/25/17 1738 98 %     Weight 06/25/17 1741 201 lb (91.2 kg)     Height 06/25/17 1741 5\' 2"  (1.575 m)     Head Circumference --      Peak Flow --      Pain Score 06/25/17 1741 0     Pain Loc --      Pain Edu? --      Excl. in GC? --    No data found.  Updated Vital Signs BP 139/85 (BP Location: Right Arm)   Pulse 79   Temp 98 F (36.7 C) (Oral)   Ht 5\' 2"  (1.575 m)   Wt 201 lb (91.2 kg)   LMP 06/07/2017   SpO2 98%   BMI 36.76 kg/m   Visual Acuity Right Eye Distance:   Left Eye Distance:   Bilateral Distance:    Right Eye Near:   Left Eye Near:    Bilateral Near:     Physical Exam  Constitutional: She appears well-developed and well-nourished.  HENT:  Head: Normocephalic.  Right Ear: External ear normal.  Left Ear: External ear normal.  Mouth/Throat: Oropharynx is clear and moist.  Eyes: EOM are normal. Pupils are equal, round, and reactive to light.  Neck: Normal range of motion.  Cardiovascular: Normal rate.  Pulmonary/Chest: Effort normal.  Abdominal: Soft.  Musculoskeletal: Normal range of motion.  Neurological: She is alert.  Skin: Skin is warm.  Psychiatric: She has a normal mood and affect.  Nursing note and vitals reviewed.    UC Treatments / Results  Labs (all labs ordered are listed, but only abnormal results are displayed) Labs Reviewed  POCT URINALYSIS DIP (MANUAL ENTRY) - Abnormal; Notable for the following components:      Result Value   Ketones, POC UA large (80) (*)    Blood, UA small (*)    Leukocytes, UA Small (1+) (*)    All other components within normal limits  CBC WITH DIFFERENTIAL/PLATELET  COMPREHENSIVE METABOLIC PANEL    EKG  EKG Interpretation None       Radiology No results found.  Procedures Procedures (including critical care time)  Medications Ordered in UC Medications - No data to display   Initial Impression / Assessment and Plan / UC Course  I have reviewed the triage  vital signs and the nursing notes.  Pertinent labs & imaging results that were available during my care of the patient were reviewed by me and considered in my medical decision making (see chart for details).   CBC and Cmet ordered,  Pt had diarrhea earlier this week.  Labs pending.  I will treat pt with Augmentin for sinus infection     Final  Clinical Impressions(s) / UC Diagnoses   Final diagnoses:  Acute non-recurrent ethmoidal sinusitis    ED Discharge Orders        Ordered    amoxicillin-clavulanate (AUGMENTIN) 875-125 MG tablet  Every 12 hours     06/25/17 1812    An After Visit Summary was printed and given to the patient.    Controlled Substance Prescriptions [place] Controlled Substance Registry consulted? Not Applicable   Elson Areas, [place] 06/25/17 1955

## 2017-06-26 ENCOUNTER — Telehealth: Payer: Self-pay | Admitting: *Deleted

## 2017-06-26 ENCOUNTER — Telehealth (INDEPENDENT_AMBULATORY_CARE_PROVIDER_SITE_OTHER): Payer: BLUE CROSS/BLUE SHIELD | Admitting: *Deleted

## 2017-06-26 DIAGNOSIS — J0120 Acute ethmoidal sinusitis, unspecified: Secondary | ICD-10-CM | POA: Diagnosis not present

## 2017-06-26 LAB — POCT CBC W AUTO DIFF (K'VILLE URGENT CARE)

## 2017-06-26 NOTE — Telephone Encounter (Signed)
Encounter created to enter CBC order and result not entered on DOS.

## 2017-06-26 NOTE — Telephone Encounter (Signed)
Spoke to pt given CMP results. Rebecca Shea Rebecca Lupinacci, LPN

## 2017-06-27 LAB — COMPREHENSIVE METABOLIC PANEL
AG Ratio: 1.4 (calc) (ref 1.0–2.5)
ALBUMIN MSPROF: 4.2 g/dL (ref 3.6–5.1)
ALT: 12 U/L (ref 6–29)
AST: 16 U/L (ref 10–30)
Alkaline phosphatase (APISO): 67 U/L (ref 33–115)
BUN: 11 mg/dL (ref 7–25)
CHLORIDE: 104 mmol/L (ref 98–110)
CO2: 26 mmol/L (ref 20–32)
CREATININE: 0.55 mg/dL (ref 0.50–1.10)
Calcium: 9.3 mg/dL (ref 8.6–10.2)
GLOBULIN: 3 g/dL (ref 1.9–3.7)
GLUCOSE: 79 mg/dL (ref 65–99)
POTASSIUM: 4.5 mmol/L (ref 3.5–5.3)
SODIUM: 138 mmol/L (ref 135–146)
TOTAL PROTEIN: 7.2 g/dL (ref 6.1–8.1)
Total Bilirubin: 0.4 mg/dL (ref 0.2–1.2)

## 2017-06-27 LAB — CBC WITH DIFFERENTIAL/PLATELET

## 2018-06-30 DIAGNOSIS — L659 Nonscarring hair loss, unspecified: Secondary | ICD-10-CM | POA: Insufficient documentation

## 2018-06-30 DIAGNOSIS — G43909 Migraine, unspecified, not intractable, without status migrainosus: Secondary | ICD-10-CM | POA: Insufficient documentation

## 2018-06-30 DIAGNOSIS — R87619 Unspecified abnormal cytological findings in specimens from cervix uteri: Secondary | ICD-10-CM | POA: Insufficient documentation

## 2018-06-30 DIAGNOSIS — D649 Anemia, unspecified: Secondary | ICD-10-CM | POA: Insufficient documentation

## 2018-12-05 ENCOUNTER — Ambulatory Visit: Payer: BC Managed Care – PPO

## 2018-12-05 ENCOUNTER — Telehealth: Payer: Self-pay | Admitting: *Deleted

## 2018-12-05 ENCOUNTER — Encounter: Payer: Self-pay | Admitting: Family Medicine

## 2018-12-05 ENCOUNTER — Ambulatory Visit (INDEPENDENT_AMBULATORY_CARE_PROVIDER_SITE_OTHER): Payer: BC Managed Care – PPO | Admitting: Family Medicine

## 2018-12-05 DIAGNOSIS — S82831A Other fracture of upper and lower end of right fibula, initial encounter for closed fracture: Secondary | ICD-10-CM

## 2018-12-05 DIAGNOSIS — M25561 Pain in right knee: Secondary | ICD-10-CM | POA: Diagnosis not present

## 2018-12-05 DIAGNOSIS — M25571 Pain in right ankle and joints of right foot: Secondary | ICD-10-CM

## 2018-12-05 NOTE — Telephone Encounter (Signed)
Received vm on triage phone from Dr. Jola Baptist stating this pt was in their office and had a fall and believes she may have a fx R ankle and wanted to know if can get pt in to see Dr. Junius Roads today, Dr. Junius Roads did not have any openings today but I did contact pt and left vm letting her know that we have other openings with other providers and to call office and we can try to get her in with one of them.

## 2018-12-05 NOTE — Progress Notes (Signed)
Office Visit Note   Patient: Rebecca Shea           Date of Birth: 05/30/1970           MRN: [id number] Visit Date: 12/05/2018 Requested by: Agapito GamesMetheney, Catherine D, MD [address] [place],  [place] [id number] PCP: Agapito GamesMetheney, Catherine D, MD  Subjective: Chief Complaint  Patient presents with  . Right Ankle - Pain    Fell on 11/28/18, twisting the foot and ankle. Bruising on lateral foot and ankle. Most of the pain is in the lateral lower leg.  . Right Foot - Pain    HPI: She is here at the request of Dr. Mayford KnifeWilliams for right ankle and foot pain.  On August 7 she fell on some steps twisting her ankle.  She was able to get up and bear weight but she has been walking with a limp and her foot and ankle have been bruised and swollen since then.  She also complains of pain lateral to her knee.  She has a history of left ankle fracture in the past, no right ankle problems.  She is otherwise in pretty good health.              ROS: Denies fevers or chills.  All other systems were reviewed and are negative.  Objective: Vital Signs: There were no vitals taken for this visit.  Physical Exam:  General:  Alert and oriented, in no acute distress. Pulm:  Breathing unlabored. Psy:  Normal mood, congruent affect. Skin: No skin breakdown or rash.  She does have bruising at the lateral ankle and dorsally on the foot near the fourth and fifth MTP joints. Right knee: She has moderate tenderness to palpation near the proximal fibula.  Ankle dorsiflexion and eversion strength are intact.  Sensation in the foot is normal. Right ankle: She is tender primarily over the ATFL.  Not a lot of pain over the medial or lateral malleolus.  No pain at the proximal fifth metatarsal.  She is maximally tender at the distal fourth metatarsal and moderately tender at the distal fifth.  No rotational deformity of her toes.   Imaging: X-rays right knee: AP view shows a proximal fibula fracture.  X-rays  right ankle: Ankle mortise is intact, no obvious fracture seen.  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    Assessment & Plan: 1.  1 week status post fall with right foot and ankle sprain, cannot rule out occult fracture. -Weightbearing as tolerated, follow-up in 3 weeks for recheck.  Repeat x-rays if still having a lot of pain.  2.  Right proximal fibula fracture, no sign of nerve injury. -Long ankle Aircast, weightbearing as tolerated.  Follow-up in 2 weeks for two-view knee x-ray.     Procedures: No procedures performed  No notes on file     PMFS History: Patient Active Problem List   Diagnosis Date Noted  . Hematuria 02/15/2014  . B12 deficiency 12/26/2011  . Anxiety 12/26/2011  . Depression 12/26/2011  . UNSPECIFIED VITAMIN D DEFICIENCY 09/13/2009   Past Medical History:  Diagnosis Date  . Anxiety 12/26/2011  . Headache     Family History  Problem Relation Age of Onset  . Healthy Mother   . Healthy Father   . Healthy Maternal Grandmother   . Hypertension Maternal Grandfather   . Hypercholesterolemia Maternal Grandfather   . Diabetes Maternal Grandfather   . Transient ischemic attack Maternal Grandfather   .  Hypertension Paternal Grandmother   . Angina Paternal Grandmother   . Hypercholesterolemia Paternal Grandmother   . Transient ischemic attack Paternal Grandmother   . Heart attack Paternal Grandmother   . Parkinson's disease Paternal Grandfather   . Stroke Paternal Grandfather   . Heart attack Paternal Grandfather   . Prostate cancer Paternal Grandfather     Past Surgical History:  Procedure Laterality Date  . BUNIONECTOMY Bilateral   . ELBOW FRACTURE SURGERY Left   . WISDOM TOOTH EXTRACTION     x 4   Social History   Occupational History  . Occupation: Engineer, maintenance (IT)  Tobacco Use  . Smoking status: Never Smoker  . Smokeless tobacco: Never Used  Substance and Sexual Activity  . Alcohol use: Yes     Alcohol/week: 0.0 standard drinks    Comment: One drink per day.  . Drug use: No  . Sexual activity: Not on file

## 2018-12-05 NOTE — Patient Instructions (Addendum)
   Vitamin D3:  5,000 IU daily  Vitamin K2:  100 mcg daily  Magnesium:  200-400 mg daily    

## 2018-12-19 ENCOUNTER — Ambulatory Visit (INDEPENDENT_AMBULATORY_CARE_PROVIDER_SITE_OTHER): Payer: BC Managed Care – PPO | Admitting: Family Medicine

## 2018-12-19 ENCOUNTER — Ambulatory Visit: Payer: Self-pay

## 2018-12-19 ENCOUNTER — Encounter: Payer: Self-pay | Admitting: Family Medicine

## 2018-12-19 DIAGNOSIS — M25571 Pain in right ankle and joints of right foot: Secondary | ICD-10-CM

## 2018-12-19 DIAGNOSIS — S82831A Other fracture of upper and lower end of right fibula, initial encounter for closed fracture: Secondary | ICD-10-CM

## 2018-12-19 MED ORDER — VITAMIN K2 100 MCG PO TABS: 100.0000 ug | ORAL_TABLET | Freq: Every day | ORAL | 3 refills | Status: DC

## 2018-12-19 NOTE — Progress Notes (Signed)
Office Visit Note   Patient: Rebecca LippsMelissa C Dass           Date of Birth: 01/25/1971           MRN: [id number] Visit Date: 12/19/2018 Requested by: Agapito GamesMetheney, Catherine D, MD [address] [place]  [place] [id number] PCP: Agapito GamesMetheney, Catherine D, MD  Subjective: Chief Complaint  Patient presents with  . Right Lower Leg - Fracture, Follow-up    DOI 11/28/18 Been wearing the long aircast. Less pain in the leg and ankle. Bruising is gone from the ankle, but it still swells some.     HPI: She is about 3 weeks status post fall resulting in right proximal fibula fracture and right foot sprain.  Wearing her Aircast brace, full weightbearing with only a slight limp.  Pain is steadily improving in both areas.  She is going to the beach soon and wants to know if what she can do while there.               ROS:   All other systems were reviewed and are negative.  Objective: Vital Signs: There were no vitals taken for this visit.  Physical Exam:  General:  Alert and oriented, in no acute distress. Pulm:  Breathing unlabored. Psy:  Normal mood, congruent affect. Skin: No bruising or erythema. Right leg: Still slightly tender at the proximal fibula.  Able to dorsiflex and evert the ankle against resistance with good strength.  Tender at the ATFL and a little bit at the medial malleolus.  Ligaments feel stable.  Imaging: X-rays right knee: Healing proximal fibular fracture with early callus formation and good alignment.  X-rays right ankle: Normal alignment, no obvious fracture seen.    Assessment & Plan: 1.  Clinically healing 3 weeks status post right proximal fibula fracture and right ankle sprain. -Okay to wean from Aircast brace as tolerated.  Okay to walk on the beach.  She will follow-up in about 3 weeks for recheck and repeat knee x-rays [place]     Procedures: No procedures performed  No notes on file     PMFS History: Patient Active Problem List    Diagnosis Date Noted  . Hematuria 02/15/2014  . B12 deficiency 12/26/2011  . Anxiety 12/26/2011  . Depression 12/26/2011  . UNSPECIFIED VITAMIN D DEFICIENCY 09/13/2009   Past Medical History:  Diagnosis Date  . Anxiety 12/26/2011  . Headache     Family History  Problem Relation Age of Onset  . Healthy Mother   . Healthy Father   . Healthy Maternal Grandmother   . Hypertension Maternal Grandfather   . Hypercholesterolemia Maternal Grandfather   . Diabetes Maternal Grandfather   . Transient ischemic attack Maternal Grandfather   . Hypertension Paternal Grandmother   . Angina Paternal Grandmother   . Hypercholesterolemia Paternal Grandmother   . Transient ischemic attack Paternal Grandmother   . Heart attack Paternal Grandmother   . Parkinson's disease Paternal Grandfather   . Stroke Paternal Grandfather   . Heart attack Paternal Grandfather   . Prostate cancer Paternal Grandfather     Past Surgical History:  Procedure Laterality Date  . BUNIONECTOMY Bilateral   . ELBOW FRACTURE SURGERY Left   . WISDOM TOOTH EXTRACTION     x 4   Social History   Occupational History  . Occupation: Teacher, [employer]  Tobacco Use  . Smoking status: Never Smoker  . Smokeless tobacco: Never Used  Substance and Sexual  Activity  . Alcohol use: Yes    Alcohol/week: 0.0 standard drinks    Comment: One drink per day.  . Drug use: No  . Sexual activity: Not on file

## 2019-01-09 ENCOUNTER — Ambulatory Visit: Payer: Self-pay

## 2019-01-09 ENCOUNTER — Ambulatory Visit (INDEPENDENT_AMBULATORY_CARE_PROVIDER_SITE_OTHER): Payer: BC Managed Care – PPO | Admitting: Family Medicine

## 2019-01-09 ENCOUNTER — Encounter: Payer: Self-pay | Admitting: Family Medicine

## 2019-01-09 DIAGNOSIS — S82831A Other fracture of upper and lower end of right fibula, initial encounter for closed fracture: Secondary | ICD-10-CM

## 2019-01-09 NOTE — Progress Notes (Signed)
Office Visit Note   Patient: Rebecca LippsMelissa C Saline           Date of Birth: 04/17/1971           MRN: [id number] Visit Date: 01/09/2019 Requested by: Agapito GamesMetheney, Catherine D, MD [address] [place],  [place] [id number] PCP: Agapito GamesMetheney, Catherine D, MD  Subjective: Chief Complaint  Patient presents with  . Right Lower Leg - Follow-up    DOI 11/28/18 - proximal fibula is feeling better. Still having some issues with the ankle. Did walk 10K steps one day at the beach - "did great." Still wearing long aircast.    HPI: She is here for follow-up roughly 5 weeks status post right proximal fibula fracture and right ankle sprain.  Overall making steady progress.  She leaves for [place] next week.              ROS: No fevers or chills.  All other systems were reviewed and are negative.  Objective: Vital Signs: There were no vitals taken for this visit.  Physical Exam:  General:  Alert and oriented, in no acute distress. Pulm:  Breathing unlabored. Psy:  Normal mood, congruent affect. Skin: No rash on the skin. Right knee: Very minimal tenderness to palpation of the proximal fibula today. Right ankle: Tender over the ATFL with slight laxity on anterior drawer testing, no laxity with talar tilt.  Ankle strength is normal.  Imaging: X-rays right knee: Abundant callus formation at the fracture site, anatomic alignment.  Assessment & Plan: 1.  Clinically healing right proximal fibula fracture and right ankle sprain. -Okay to discontinue Aircast brace.  She will purchase an ankle sleeve or ASO brace at sporting goods store to wear on uneven surfaces.  Home exercises with Thera-Band given today.  Follow-up as needed.     Procedures: No procedures performed  No notes on file     PMFS History: Patient Active Problem List   Diagnosis Date Noted  . Abnormal cervical Papanicolaou smear 06/30/2018  . Anemia 06/30/2018  . Loss of hair 06/30/2018  . Migraine 06/30/2018  . Hematuria  02/15/2014  . B12 deficiency 12/26/2011  . Anxiety 12/26/2011  . Depression 12/26/2011  . UNSPECIFIED VITAMIN D DEFICIENCY 09/13/2009   Past Medical History:  Diagnosis Date  . Anxiety 12/26/2011  . Headache     Family History  Problem Relation Age of Onset  . Healthy Mother   . Healthy Father   . Healthy Maternal Grandmother   . Hypertension Maternal Grandfather   . Hypercholesterolemia Maternal Grandfather   . Diabetes Maternal Grandfather   . Transient ischemic attack Maternal Grandfather   . Hypertension Paternal Grandmother   . Angina Paternal Grandmother   . Hypercholesterolemia Paternal Grandmother   . Transient ischemic attack Paternal Grandmother   . Heart attack Paternal Grandmother   . Parkinson's disease Paternal Grandfather   . Stroke Paternal Grandfather   . Heart attack Paternal Grandfather   . Prostate cancer Paternal Grandfather     Past Surgical History:  Procedure Laterality Date  . BUNIONECTOMY Bilateral   . ELBOW FRACTURE SURGERY Left   . WISDOM TOOTH EXTRACTION     x 4   Social History   Occupational History  . Occupation: Teacher, [employer]  Tobacco Use  . Smoking status: Never Smoker  . Smokeless tobacco: Never Used  Substance and Sexual Activity  . Alcohol use: Yes    Alcohol/week: 0.0 standard drinks    Comment: One drink per  day.  . Drug use: No  . Sexual activity: Not on file

## 2020-02-03 DIAGNOSIS — H5213 Myopia, bilateral: Secondary | ICD-10-CM | POA: Diagnosis not present

## 2020-03-30 DIAGNOSIS — Z1231 Encounter for screening mammogram for malignant neoplasm of breast: Secondary | ICD-10-CM | POA: Diagnosis not present

## 2020-04-26 DIAGNOSIS — Z1159 Encounter for screening for other viral diseases: Secondary | ICD-10-CM | POA: Diagnosis not present

## 2020-05-05 DIAGNOSIS — N920 Excessive and frequent menstruation with regular cycle: Secondary | ICD-10-CM | POA: Diagnosis not present

## 2020-05-05 DIAGNOSIS — Z01419 Encounter for gynecological examination (general) (routine) without abnormal findings: Secondary | ICD-10-CM | POA: Diagnosis not present

## 2020-05-05 DIAGNOSIS — D649 Anemia, unspecified: Secondary | ICD-10-CM | POA: Diagnosis not present

## 2020-05-05 DIAGNOSIS — Z6841 Body Mass Index (BMI) 40.0 and over, adult: Secondary | ICD-10-CM | POA: Diagnosis not present

## 2020-08-03 DIAGNOSIS — Z20828 Contact with and (suspected) exposure to other viral communicable diseases: Secondary | ICD-10-CM | POA: Diagnosis not present

## 2020-08-09 DIAGNOSIS — D649 Anemia, unspecified: Secondary | ICD-10-CM | POA: Diagnosis not present

## 2020-08-12 ENCOUNTER — Other Ambulatory Visit: Payer: Self-pay

## 2020-08-12 ENCOUNTER — Ambulatory Visit (INDEPENDENT_AMBULATORY_CARE_PROVIDER_SITE_OTHER): Payer: BLUE CROSS/BLUE SHIELD | Admitting: Family Medicine

## 2020-08-12 ENCOUNTER — Encounter: Payer: Self-pay | Admitting: Family Medicine

## 2020-08-12 ENCOUNTER — Ambulatory Visit: Payer: Self-pay

## 2020-08-12 DIAGNOSIS — M79601 Pain in right arm: Secondary | ICD-10-CM

## 2020-08-12 NOTE — Progress Notes (Signed)
Office Visit Note   Patient: Rebecca Shea           Date of Birth: 1970-05-07           MRN: [id number] Visit Date: 08/12/2020 Requested by: Agapito Games, MD [address] [place],  [place] [address] PCP: Agapito Games, MD  Subjective: Chief Complaint  Patient presents with  . Right Upper Arm - Pain    Pain in the upper arm x at least 8 months, worse for the last 4 months. NKI. Right-hand dominant.     HPI: She is here with right arm pain.  Symptoms started about 8 months ago, no injury.  Gradual onset of pain in the lateral shoulder with some radiation down the arm toward the elbow.  She has been working with Dr. Mayford Knife occasionally, and another friend of hers does massage therapy and she tried that as well.  She is getting temporary relief but the pain keeps coming back.  No numbness or tingling in her arm.  Occasional neck pain.  Incidentally, her mother died at age 86 about a month ago from COVID.  Patient is now the sole caretaker of her 48 year old grandmother.               ROS: No rash.  All other systems were reviewed and are negative.  Objective: Vital Signs: There were no vitals taken for this visit.  Physical Exam:  General:  Alert and oriented, in no acute distress. Pulm:  Breathing unlabored. Psy:  Normal mood, congruent affect  Right arm: She has full neck range of motion, slight pain with rotation to the right but negative Spurling's test.  She has some tenderness in the right trapezius belly.  Shoulder range of motion is full with no adhesive capsulitis.  She has some tenderness to palpation in the lateral subacromial space.  Isometric rotator cuff strength is 5/5 throughout.  Biceps, triceps, wrist and intrinsic hand strength are normal.    Imaging: XR Cervical Spine 2 or 3 views  Result Date: 08/12/2020 X-rays of the cervical spine reveal moderate diffuse degenerative disc disease from C2-C7.  She has multilevel facet  arthropathy as well.  No sign of compression fracture, no sign of neoplasm.  XR Shoulder Right  Result Date: 08/12/2020 X-rays of the right shoulder reveal normal anatomic alignment, no significant degenerative change, no soft tissue calcifications, no sign of neoplasm.   Assessment & Plan: 1.  Right shoulder pain, suspect acromial bursitis.  Cannot rule out cervical radiculopathy. -Discussed options and elected to inject the subacromial space.  If she fails to improve, consider MRI scan of shoulder and/or neck.  2.  Cervical spondylosis -Continue working with Dr. Mayford Knife.  Do home strengthening exercises as well.      Procedures: : After sterile prep with Betadine, injected 3 cc 0.25% bupivacaine and 40 mg Depo-Medrol from lateral approach.       PMFS History: Patient Active Problem List   Diagnosis Date Noted  . Abnormal cervical Papanicolaou smear 06/30/2018  . Anemia 06/30/2018  . Loss of hair 06/30/2018  . Migraine 06/30/2018  . Hematuria 02/15/2014  . B12 deficiency 12/26/2011  . Anxiety 12/26/2011  . Depression 12/26/2011  . UNSPECIFIED VITAMIN D DEFICIENCY 09/13/2009   Past Medical History:  Diagnosis Date  . Anxiety 12/26/2011  . Headache     Family History  Problem Relation Age of Onset  . Healthy Mother   . Healthy Father   .  Healthy Maternal Grandmother   . Hypertension Maternal Grandfather   . Hypercholesterolemia Maternal Grandfather   . Diabetes Maternal Grandfather   . Transient ischemic attack Maternal Grandfather   . Hypertension Paternal Grandmother   . Angina Paternal Grandmother   . Hypercholesterolemia Paternal Grandmother   . Transient ischemic attack Paternal Grandmother   . Heart attack Paternal Grandmother   . Parkinson's disease Paternal Grandfather   . Stroke Paternal Grandfather   . Heart attack Paternal Grandfather   . Prostate cancer Paternal Grandfather     Past Surgical History:  Procedure Laterality Date  . BUNIONECTOMY  Bilateral   . ELBOW FRACTURE SURGERY Left   . WISDOM TOOTH EXTRACTION     x 4   Social History   Occupational History  . Occupation: Teacher, English as a foreign language  Tobacco Use  . Smoking status: Never Smoker  . Smokeless tobacco: Never Used  Vaping Use  . Vaping Use: Never used  Substance and Sexual Activity  . Alcohol use: Yes    Alcohol/week: 0.0 standard drinks    Comment: One drink per day.  . Drug use: No  . Sexual activity: Not on file

## 2020-11-29 ENCOUNTER — Telehealth: Payer: Self-pay | Admitting: Family Medicine

## 2020-11-29 DIAGNOSIS — M542 Cervicalgia: Secondary | ICD-10-CM

## 2020-11-29 DIAGNOSIS — M79601 Pain in right arm: Secondary | ICD-10-CM

## 2020-11-29 NOTE — Telephone Encounter (Signed)
I called and advised that these were ordered and the imaging center will be calling her to schedule

## 2020-11-29 NOTE — Telephone Encounter (Signed)
Pt wondering about getting an MRI on her right arm // shoulder because she is still in a lot of pain.  [phone]

## 2020-11-29 NOTE — Addendum Note (Signed)
Addended by: Lillia Carmel on: 11/29/2020 03:25 PM   Modules accepted: Orders

## 2020-12-11 ENCOUNTER — Ambulatory Visit
Admission: RE | Admit: 2020-12-11 | Discharge: 2020-12-11 | Disposition: A | Discharge status: Home / Self Care | Payer: BLUE CROSS/BLUE SHIELD | Source: Ambulatory Visit | Attending: Family Medicine | Admitting: Family Medicine

## 2020-12-11 ENCOUNTER — Other Ambulatory Visit: Payer: Self-pay

## 2020-12-11 DIAGNOSIS — M542 Cervicalgia: Secondary | ICD-10-CM

## 2020-12-11 DIAGNOSIS — M79601 Pain in right arm: Secondary | ICD-10-CM

## 2020-12-11 DIAGNOSIS — M4802 Spinal stenosis, cervical region: Secondary | ICD-10-CM | POA: Diagnosis not present

## 2020-12-11 DIAGNOSIS — M25511 Pain in right shoulder: Secondary | ICD-10-CM | POA: Diagnosis not present

## 2020-12-11 IMAGING — MR MR SHOULDER*R* W/O CM
4 of 5 series · 29 of 40 positions shown · non-contrast
Comparison: None.

CLINICAL DATA: Right shoulder pain extending into the arm for many
years

EXAM:
MRI OF THE RIGHT SHOULDER WITHOUT CONTRAST
TECHNIQUE: Multiplanar, multisequence MR imaging of the shoulder was performed.
No intravenous contrast was administered.

[Series 3: T2 fat-sat · axial · 4.0mm · 0.27mm/px · z∈[-79,+50]mm · 8 of 30 slices shown (1 of 3)]
[im 1/30]
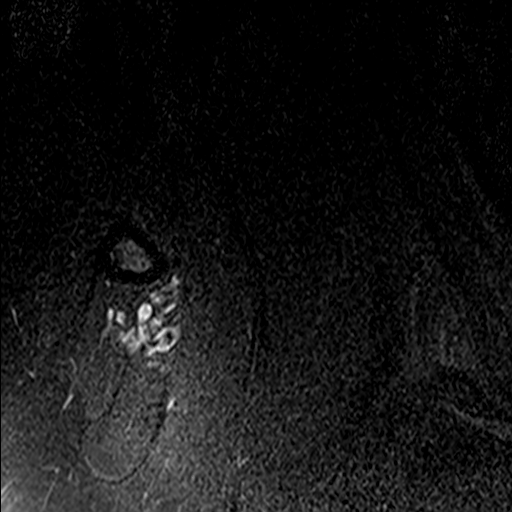
[im 4/30]
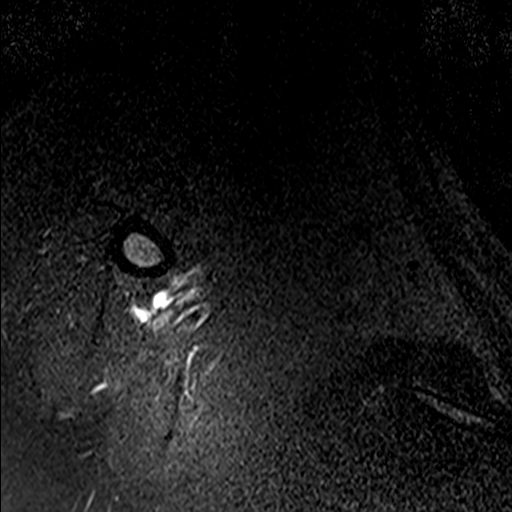
[im 10/30]
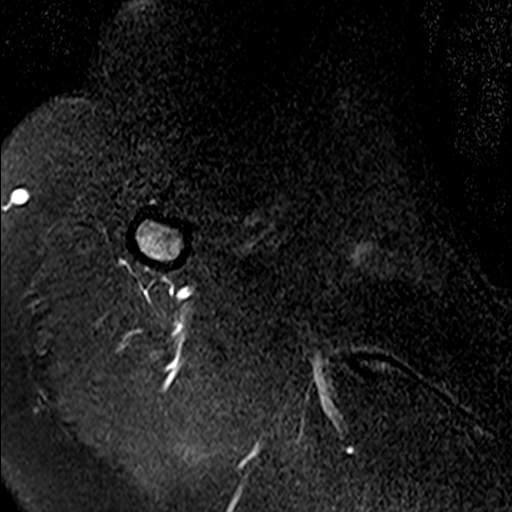
[im 13/30]
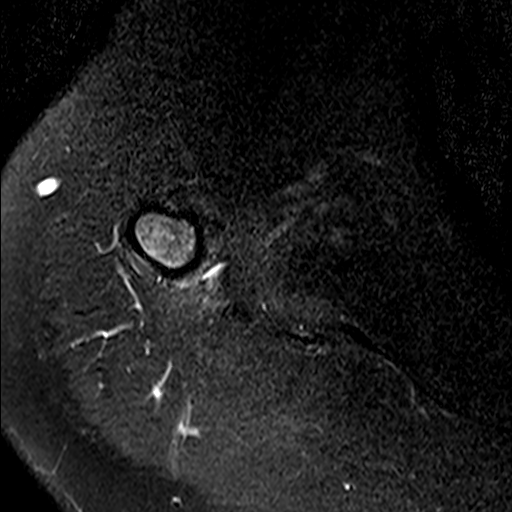
[im 17/30]
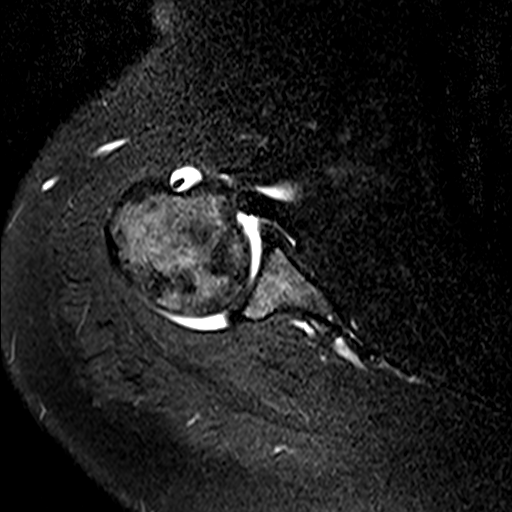
[im 20/30]
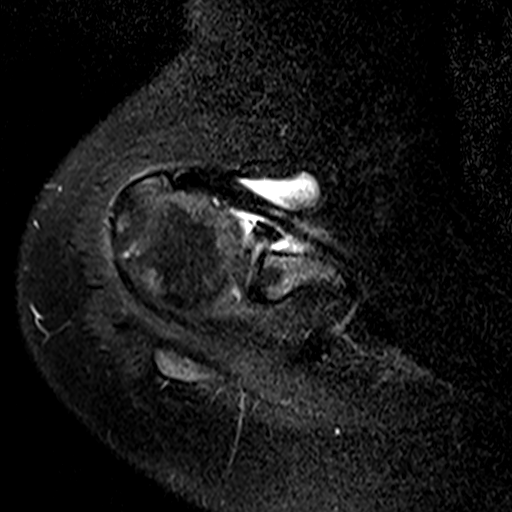
[im 26/30]
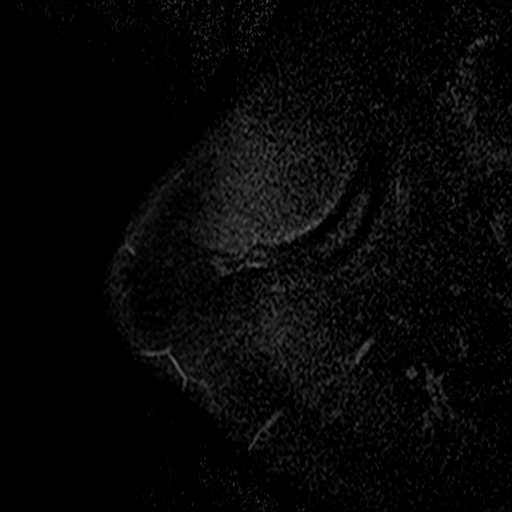
[im 30/30]
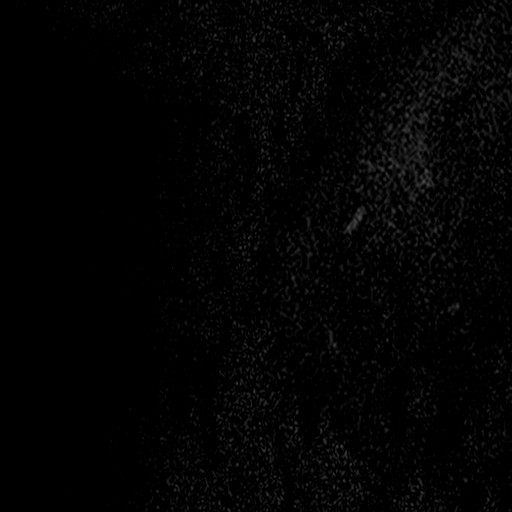

[Series 4: T2 fat-sat · oblique · 4.0mm · 0.55mm/px · 7 of 18 slices shown (2 of 3)]
[im 1/18]
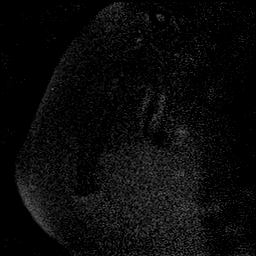
[im 3/18]
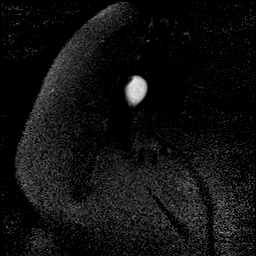
[im 6/18]
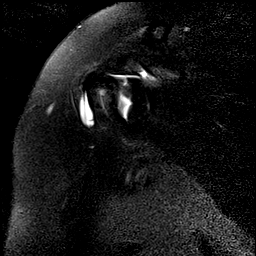
[im 9/18]
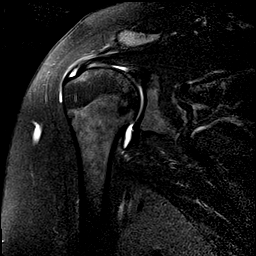
[im 12/18]
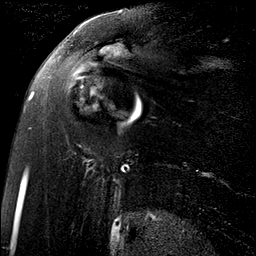
[im 15/18]
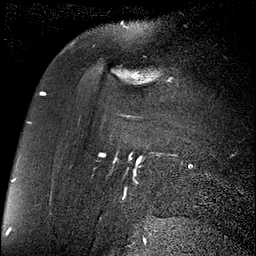
[im 18/18]
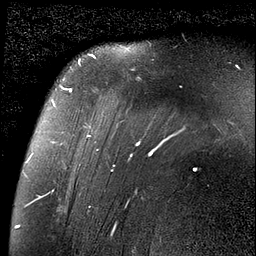

[Series 5: PD · oblique · 4.0mm · 0.27mm/px · 7 of 18 slices shown]
[im 1/18]
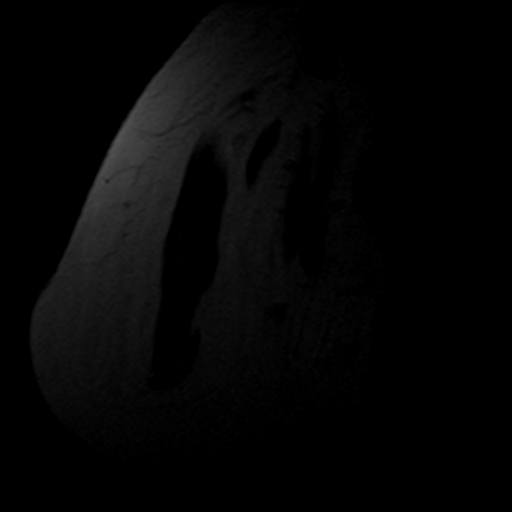
[im 3/18]
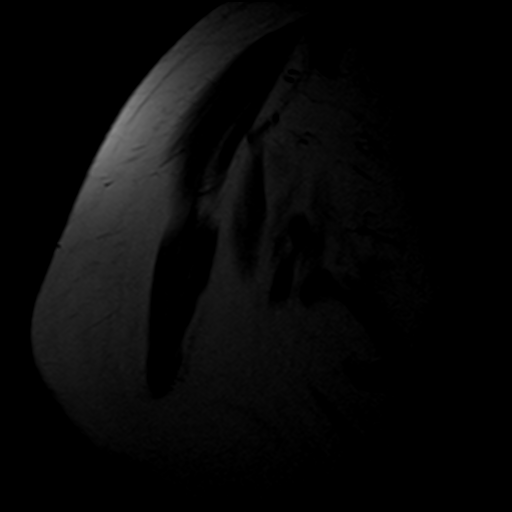
[im 6/18]
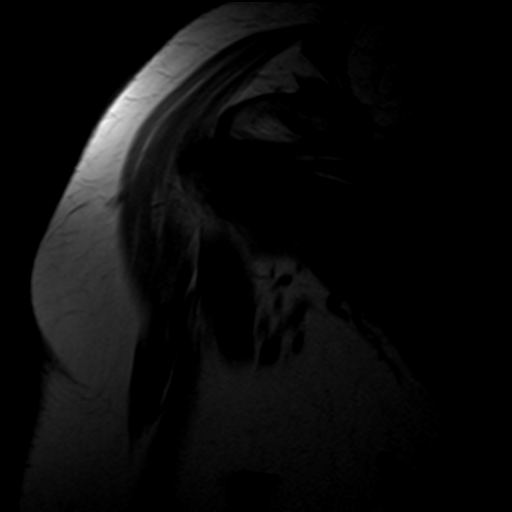
[im 9/18]
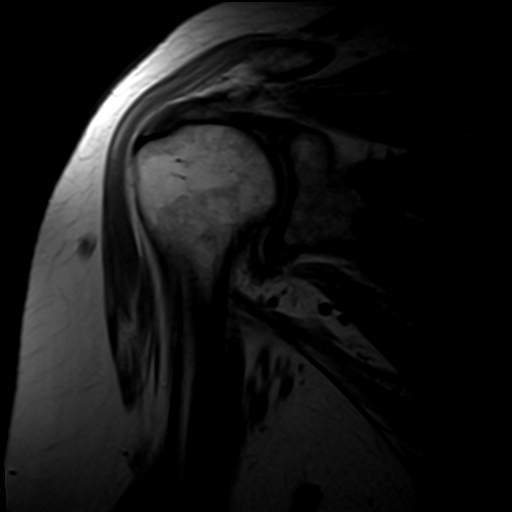
[im 12/18]
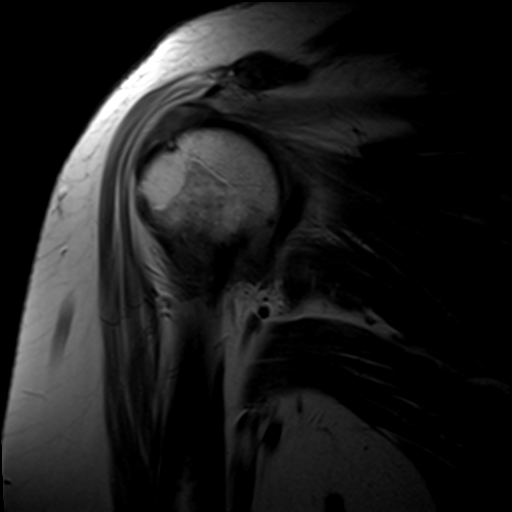
[im 15/18]
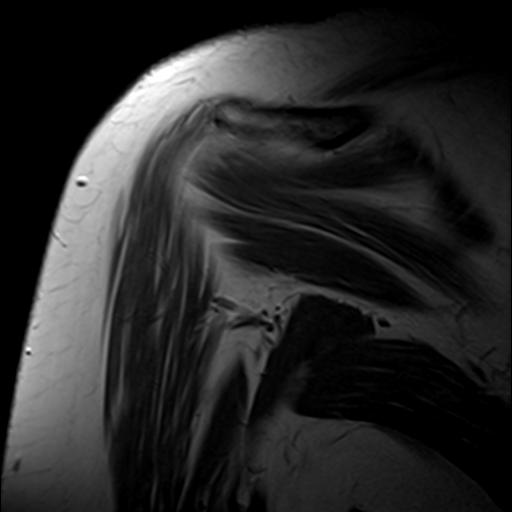
[im 18/18]
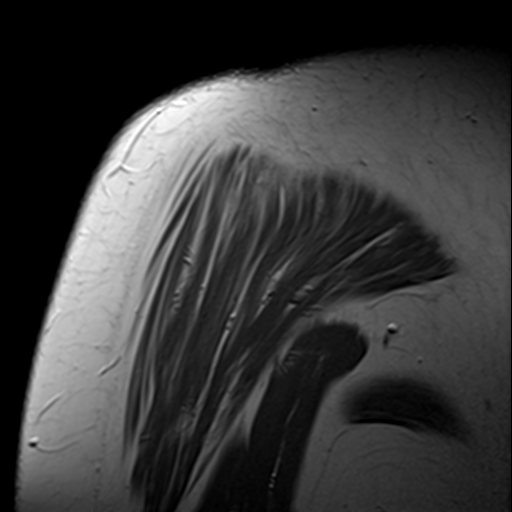

[Series 6: T2 fat-sat · oblique · 4.0mm · 0.55mm/px · 7 of 21 slices shown (3 of 3)]
[im 1/21]
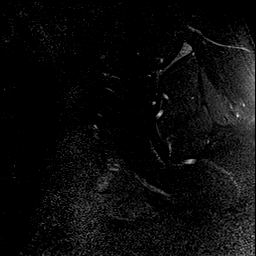
[im 3/21]
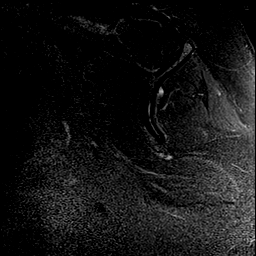
[im 6/21]
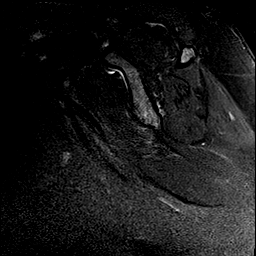
[im 9/21]
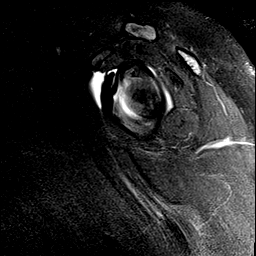
[im 12/21]
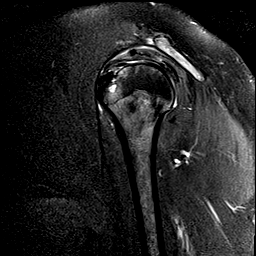
[im 15/21]
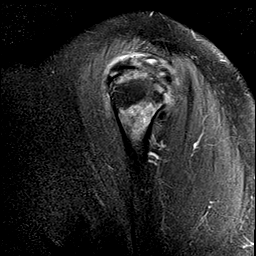
[im 18/21]
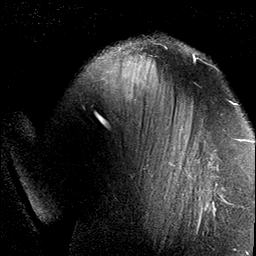

[29 of 40 positions shown; findings below may reference images not displayed]

FINDINGS: Rotator cuff: Moderate tendinosis of the supraspinatus tendon with a
large insertional interstitial tear. Moderate tendinosis of the
infraspinatus tendon with a small partial-thickness bursal surface
tear. Teres minor tendon is intact. Subscapularis tendon is intact.

Muscles: No muscle atrophy or edema. No intramuscular fluid
collection or hematoma.

Biceps Long Head: Intraarticular and extraarticular portions of the
biceps tendon are intact.

Acromioclavicular Joint: Normal acromioclavicular joint. No
subacromial/subdeltoid bursal fluid.

Glenohumeral Joint: Small joint effusion.  No chondral defect.

Labrum: Limited evaluation of the labrum secondary lack of
intra-articular fluid. Superior labral degeneration.

Bones: No fracture or dislocation. No aggressive osseous lesion.

Other: No fluid collection or hematoma.
IMPRESSION: 1. Moderate tendinosis of the supraspinatus tendon with a large
insertional interstitial tear.
2. Moderate tendinosis of the infraspinatus tendon with a small
partial-thickness bursal surface tear.

## 2020-12-11 IMAGING — MR MR CERVICAL SPINE W/O CM
5 series · 32 of 48 positions shown · non-contrast
Comparison: Radiograph from [DATE].

CLINICAL DATA: Initial evaluation for chronic neck pain with right
shoulder pain extending into the right upper extremity.

EXAM:
MRI CERVICAL SPINE WITHOUT CONTRAST
TECHNIQUE: Multiplanar, multisequence MR imaging of the cervical spine was
performed. No intravenous contrast was administered.

[Series 2: T2 · sagittal · 3.0mm · 0.41mm/px · 7 of 15 slices shown (1 of 2)]
[im 1/15]
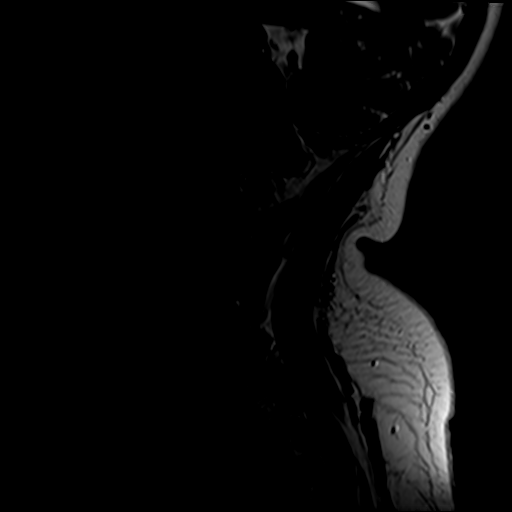
[im 3/15]
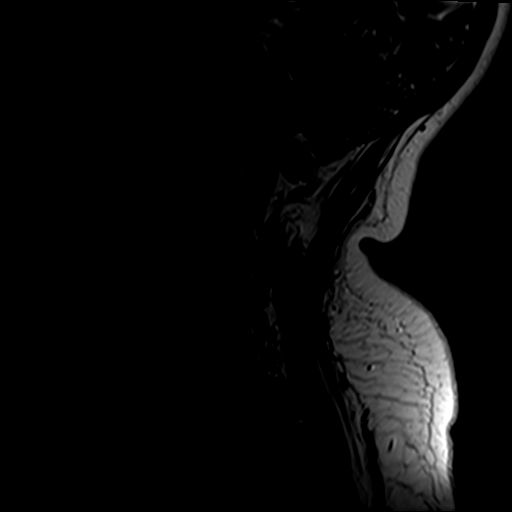
[im 5/15]
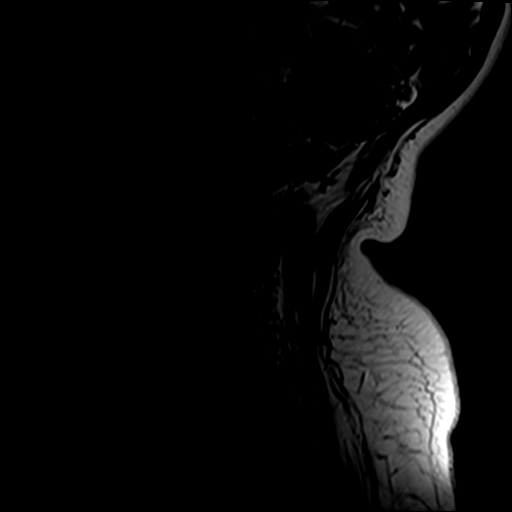
[im 8/15]
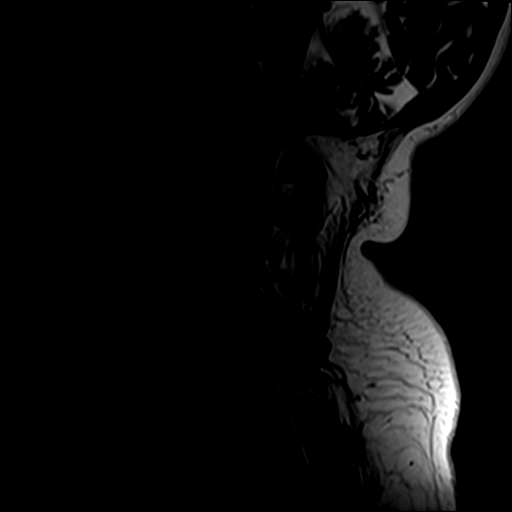
[im 10/15]
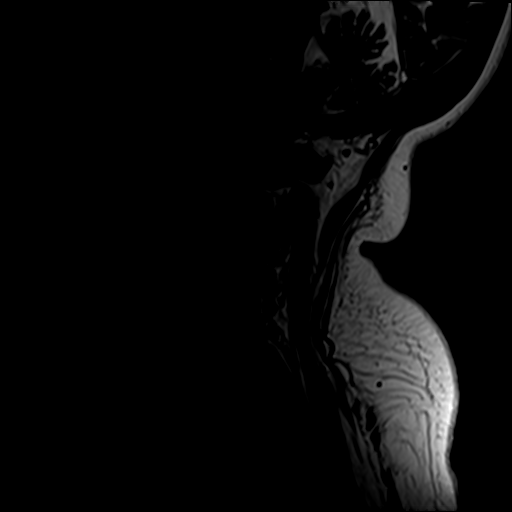
[im 12/15]
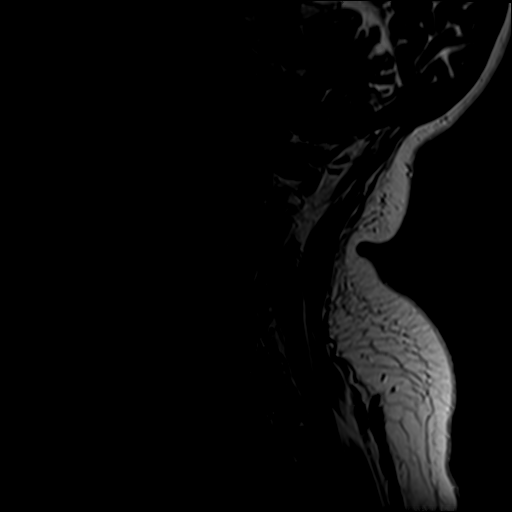
[im 15/15]
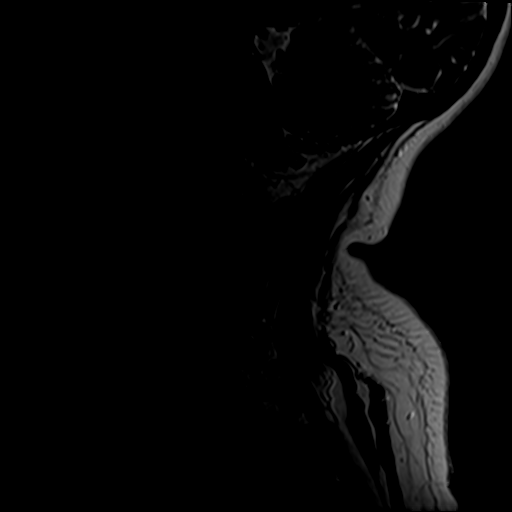

[Series 3: STIR · sagittal · 3.0mm · 0.82mm/px · 7 of 15 slices shown]
[im 1/15]
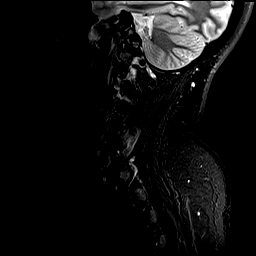
[im 3/15]
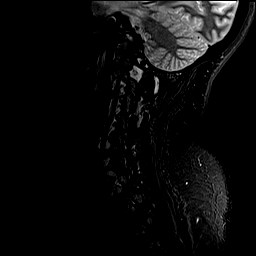
[im 5/15]
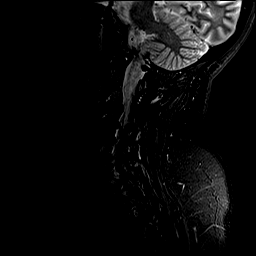
[im 8/15]
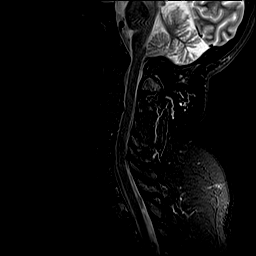
[im 10/15]
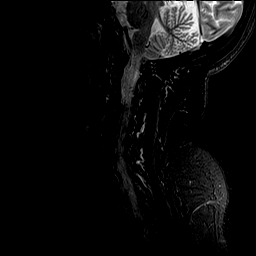
[im 12/15]
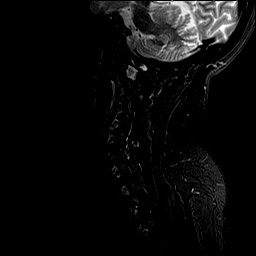
[im 15/15]
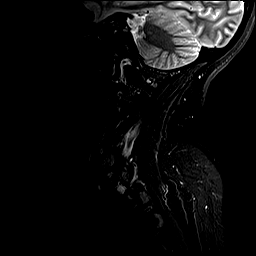

[Series 4: T1 · sagittal · 3.0mm · 0.82mm/px · 6 of 15 slices shown]
[im 1/15]
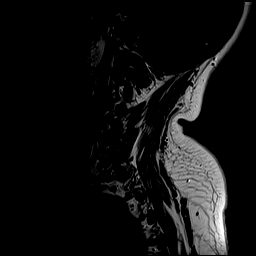
[im 3/15]
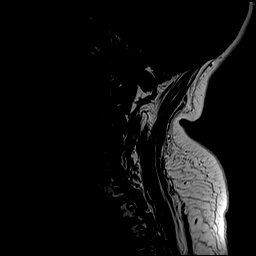
[im 6/15]
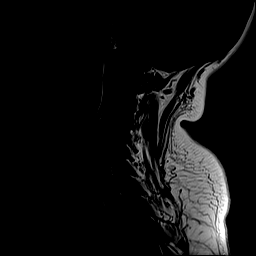
[im 9/15]
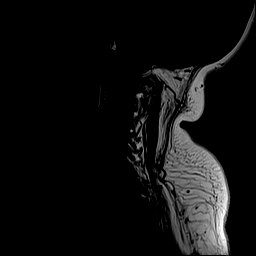
[im 12/15]
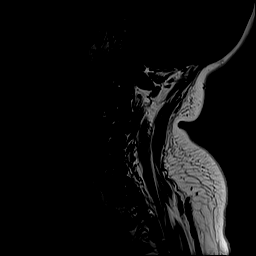
[im 15/15]
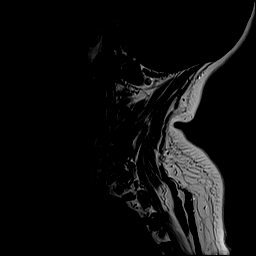

[Series 5: T2 · axial · 3.0mm · 0.70mm/px · z∈[-105,+19]mm · 8 of 34 slices shown (2 of 2)]
[im 1/34]
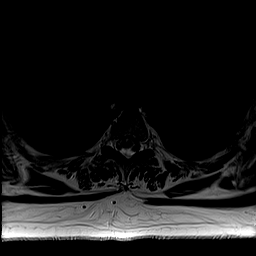
[im 6/34]
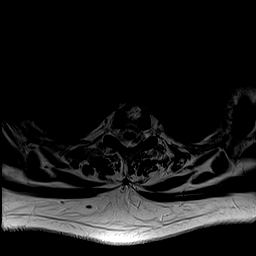
[im 11/34]
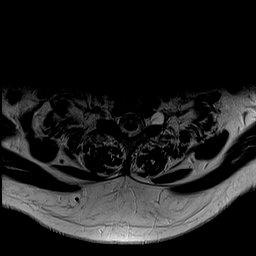
[im 16/34]
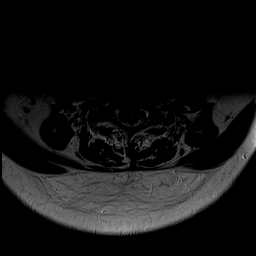
[im 18/34]
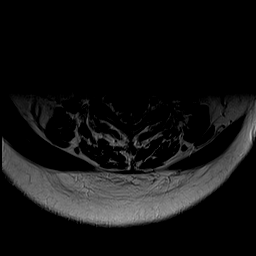
[im 23/34]
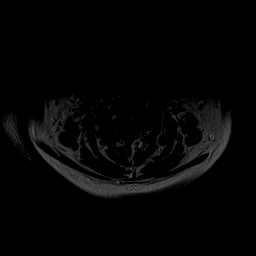
[im 28/34]
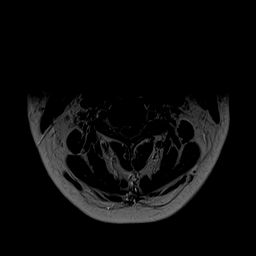
[im 34/34]
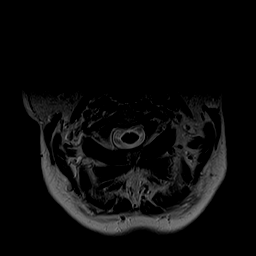

[Series 6: GRE · axial · 3.0mm · 0.35mm/px · z∈[-105,-49]mm · 4 of 34 slices shown]
[im 1/34]
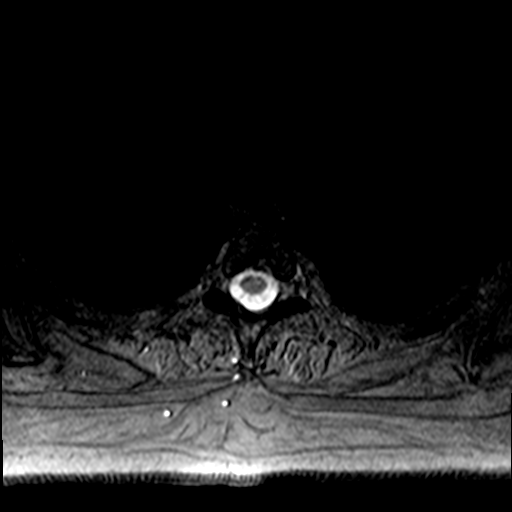
[im 6/34]
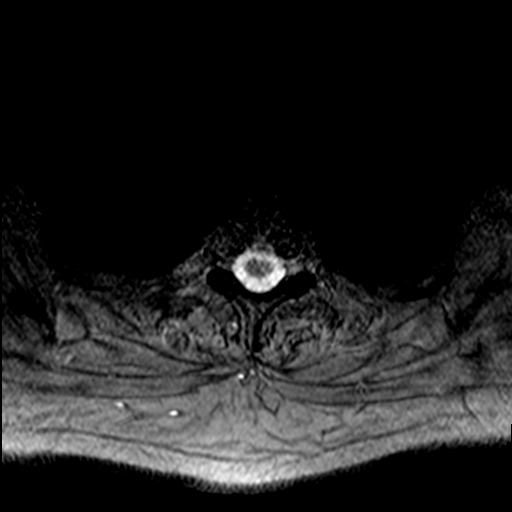
[im 11/34]
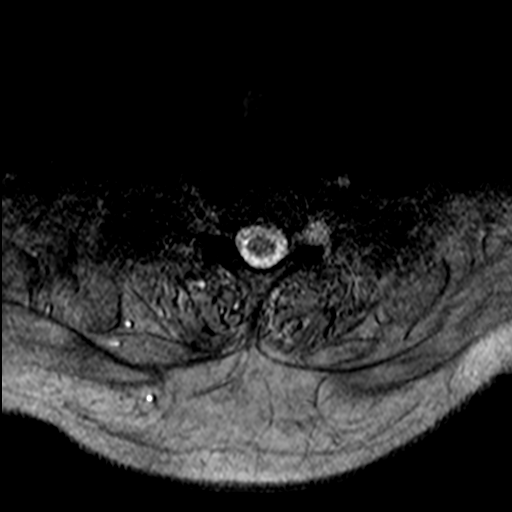
[im 16/34]
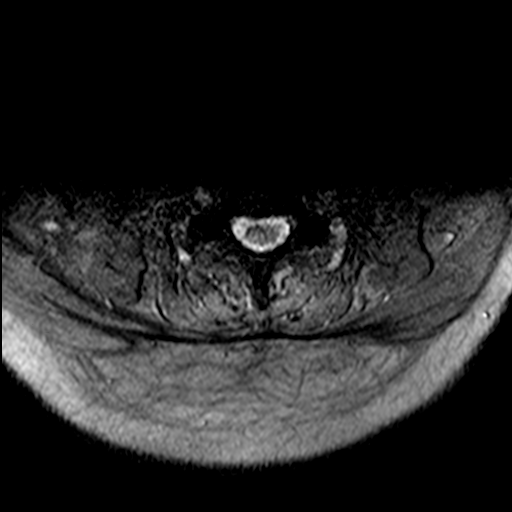

[32 of 48 positions shown; findings below may reference images not displayed]

FINDINGS: Alignment: Straightening with reversal of the normal upper cervical
lordosis. Trace anterolisthesis of C2 on C3, with trace
retrolisthesis of C4 on C5.

Vertebrae: Vertebral body height maintained without acute or chronic
fracture. Bone marrow signal intensity within normal limits. Benign
hemangioma noted within the T2 vertebral body. No worrisome osseous
lesions. No abnormal marrow edema.

Cord: Normal signal and morphology.

Posterior Fossa, vertebral arteries, paraspinal tissues: Visualized
brain and posterior fossa within normal limits. Craniocervical
junction normal. Paraspinous and prevertebral soft tissues within
normal limits. Normal flow voids seen within the vertebral arteries
bilaterally.

Disc levels:

C2-C3: Trace anterolisthesis. Small central disc protrusion
minimally indents the ventral thecal sac. No canal or foraminal
stenosis.

C3-C4: Disc bulge with right greater than left uncovertebral
spurring. Posterior disc osteophyte flattens and partially faces the
ventral thecal sac with resultant mild spinal stenosis. Mild to
moderate right worse than left C4 foraminal stenosis.

C4-C5: Disc bulge with right greater than left uncovertebral
spurring. Posterior disc osteophyte flattens and partially effaces
the ventral thecal sac with resultant mild spinal stenosis. Moderate
right C5 foraminal stenosis. Left neural foramen remains patent.

C5-C6: Disc bulge with right-sided uncovertebral spurring. Posterior
disc osteophyte flattens and partially faces the ventral thecal sac,
greater on the right. No significant spinal stenosis. Moderate right
C6 foraminal narrowing. Left neural foramen remains patent.

C6-C7: Mild disc bulge with bilateral uncovertebral hypertrophy.
Minimal flattening of the ventral thecal sac without significant
spinal stenosis. Moderate right with mild to moderate left C7
foraminal narrowing.

C7-T1: Negative interspace. Mild facet hypertrophy. No canal or
foraminal stenosis.

Visualized upper thoracic spine demonstrates no significant finding.
IMPRESSION: 1. Multilevel cervical spondylosis with resultant mild spinal
stenosis at C3-4 and C4-5.
2. Multifactorial degenerative changes with resultant multilevel
foraminal narrowing as above. Notable findings include mild to
moderate right worse than left C4 foraminal stenosis, moderate right
C5 and C6 foraminal narrowing, with moderate right and mild to
moderate left C7 foraminal stenosis.

## 2020-12-12 ENCOUNTER — Telehealth: Payer: Self-pay | Admitting: Family Medicine

## 2020-12-12 NOTE — Telephone Encounter (Signed)
Cervical spine MRI scan is notable for disc protrusions and bone spurs at C3-4, C4-5, C5-6 and C6-7.  There is mild to moderate narrowing of the right-sided nerve opening at C3-4, and right side at C4-5, the right side at C5-6, and the right side at C6-7.  This could cause nerve irritation at any of those levels.  We will await results of shoulder MRI scan.

## 2020-12-13 ENCOUNTER — Telehealth: Payer: Self-pay | Admitting: Family Medicine

## 2020-12-13 NOTE — Telephone Encounter (Signed)
Shoulder MRI shows a large partial tear of the rotator cuff (supraspinatus tendon).

## 2020-12-14 NOTE — Telephone Encounter (Signed)
Appt made with Dr. August Saucer on the 12th at 3:30

## 2020-12-28 DIAGNOSIS — M75101 Unspecified rotator cuff tear or rupture of right shoulder, not specified as traumatic: Secondary | ICD-10-CM | POA: Diagnosis not present

## 2020-12-28 DIAGNOSIS — M7501 Adhesive capsulitis of right shoulder: Secondary | ICD-10-CM | POA: Diagnosis not present

## 2021-01-02 ENCOUNTER — Ambulatory Visit: Payer: BLUE CROSS/BLUE SHIELD | Admitting: Orthopedic Surgery

## 2021-02-14 DIAGNOSIS — H524 Presbyopia: Secondary | ICD-10-CM | POA: Diagnosis not present

## 2021-02-14 DIAGNOSIS — H5213 Myopia, bilateral: Secondary | ICD-10-CM | POA: Diagnosis not present

## 2021-02-14 DIAGNOSIS — H52223 Regular astigmatism, bilateral: Secondary | ICD-10-CM | POA: Diagnosis not present

## 2021-03-08 DIAGNOSIS — N939 Abnormal uterine and vaginal bleeding, unspecified: Secondary | ICD-10-CM | POA: Diagnosis not present

## 2021-03-08 DIAGNOSIS — D509 Iron deficiency anemia, unspecified: Secondary | ICD-10-CM | POA: Diagnosis not present

## 2021-06-19 DIAGNOSIS — X58XXXA Exposure to other specified factors, initial encounter: Secondary | ICD-10-CM | POA: Diagnosis not present

## 2021-06-19 DIAGNOSIS — Y999 Unspecified external cause status: Secondary | ICD-10-CM | POA: Diagnosis not present

## 2021-06-19 DIAGNOSIS — M19011 Primary osteoarthritis, right shoulder: Secondary | ICD-10-CM | POA: Diagnosis not present

## 2021-06-19 DIAGNOSIS — G8918 Other acute postprocedural pain: Secondary | ICD-10-CM | POA: Diagnosis not present

## 2021-06-19 DIAGNOSIS — M13811 Other specified arthritis, right shoulder: Secondary | ICD-10-CM | POA: Diagnosis not present

## 2021-06-19 DIAGNOSIS — S46011A Strain of muscle(s) and tendon(s) of the rotator cuff of right shoulder, initial encounter: Secondary | ICD-10-CM | POA: Diagnosis not present

## 2021-06-19 DIAGNOSIS — M94211 Chondromalacia, right shoulder: Secondary | ICD-10-CM | POA: Diagnosis not present

## 2021-06-19 DIAGNOSIS — S43431A Superior glenoid labrum lesion of right shoulder, initial encounter: Secondary | ICD-10-CM | POA: Diagnosis not present

## 2021-06-22 DIAGNOSIS — M25511 Pain in right shoulder: Secondary | ICD-10-CM | POA: Diagnosis not present

## 2021-06-28 DIAGNOSIS — M25511 Pain in right shoulder: Secondary | ICD-10-CM | POA: Diagnosis not present

## 2021-07-05 DIAGNOSIS — M25511 Pain in right shoulder: Secondary | ICD-10-CM | POA: Diagnosis not present

## 2021-07-12 DIAGNOSIS — M25511 Pain in right shoulder: Secondary | ICD-10-CM | POA: Diagnosis not present

## 2021-07-18 DIAGNOSIS — M25511 Pain in right shoulder: Secondary | ICD-10-CM | POA: Diagnosis not present

## 2021-07-26 DIAGNOSIS — M25511 Pain in right shoulder: Secondary | ICD-10-CM | POA: Diagnosis not present

## 2021-08-02 DIAGNOSIS — M25511 Pain in right shoulder: Secondary | ICD-10-CM | POA: Diagnosis not present

## 2021-08-07 DIAGNOSIS — M25511 Pain in right shoulder: Secondary | ICD-10-CM | POA: Diagnosis not present

## 2021-08-09 DIAGNOSIS — M25511 Pain in right shoulder: Secondary | ICD-10-CM | POA: Diagnosis not present

## 2021-08-15 DIAGNOSIS — M25511 Pain in right shoulder: Secondary | ICD-10-CM | POA: Diagnosis not present

## 2021-08-17 DIAGNOSIS — M25511 Pain in right shoulder: Secondary | ICD-10-CM | POA: Diagnosis not present

## 2021-08-21 DIAGNOSIS — M25511 Pain in right shoulder: Secondary | ICD-10-CM | POA: Diagnosis not present

## 2021-08-23 DIAGNOSIS — M25511 Pain in right shoulder: Secondary | ICD-10-CM | POA: Diagnosis not present

## 2021-08-30 DIAGNOSIS — M25511 Pain in right shoulder: Secondary | ICD-10-CM | POA: Diagnosis not present

## 2021-09-04 DIAGNOSIS — M25511 Pain in right shoulder: Secondary | ICD-10-CM | POA: Diagnosis not present

## 2021-09-04 DIAGNOSIS — N924 Excessive bleeding in the premenopausal period: Secondary | ICD-10-CM | POA: Diagnosis not present

## 2021-09-11 DIAGNOSIS — M25511 Pain in right shoulder: Secondary | ICD-10-CM | POA: Diagnosis not present

## 2021-09-27 DIAGNOSIS — M25511 Pain in right shoulder: Secondary | ICD-10-CM | POA: Diagnosis not present

## 2021-09-28 ENCOUNTER — Telehealth: Payer: Self-pay | Admitting: Podiatry

## 2021-09-28 ENCOUNTER — Ambulatory Visit (INDEPENDENT_AMBULATORY_CARE_PROVIDER_SITE_OTHER): Payer: BC Managed Care – PPO

## 2021-09-28 ENCOUNTER — Encounter: Payer: Self-pay | Admitting: Podiatry

## 2021-09-28 ENCOUNTER — Ambulatory Visit: Payer: BLUE CROSS/BLUE SHIELD | Admitting: Podiatry

## 2021-09-28 DIAGNOSIS — M722 Plantar fascial fibromatosis: Secondary | ICD-10-CM | POA: Diagnosis not present

## 2021-09-28 DIAGNOSIS — M76822 Posterior tibial tendinitis, left leg: Secondary | ICD-10-CM

## 2021-09-28 MED ORDER — TRIAMCINOLONE ACETONIDE 10 MG/ML IJ SUSP
10.0000 mg | Freq: Once | INTRAMUSCULAR | Status: AC
  Administered 2021-09-28: 10 mg

## 2021-09-28 NOTE — Progress Notes (Signed)
Subjective:   Patient ID: Rebecca Shea, female   DOB: 51 y.o.   MRN: [id number]   HPI Patient states she is developed a lot of pain in her left ankle over the last month and she has been more active and trying to be on her foot more.  States its been swollen and she does have natural swelling but it is increased in the left ankle and is sore.  Patient does not smoke and does like to be active with moderate obesity   Review of Systems  All other systems reviewed and are negative.       Objective:  Physical Exam Vitals and nursing note reviewed.  Constitutional:      Appearance: She is well-developed.  Pulmonary:     Effort: Pulmonary effort is normal.  Musculoskeletal:        General: Normal range of motion.  Skin:    General: Skin is warm.  Neurological:     Mental Status: She is alert.     Neurovascular status found to be intact muscle strength adequate range of motion adequate posterior tib left it appears to be intact no muscle strength loss with swelling in the medial ankle and quite a bit of discomfort in the tendon as it gets close to insertion into the navicular.  Patient does have moderate flatfoot deformity good digital perfusion well oriented x3      Assessment:  Acute posterior tibial tendinitis left with inflammation moderate swelling of the ankle bilateral with some more in the left than the right with pain and flatfoot deformity     Plan:  H&P reviewed condition and recommended careful injection explaining chances for rupture along with fascial brace possibility for casting possibility for orthotics or MRI if symptoms were to remain.  Also dispensed ankle compression stocking and today I did sterile prep and injected the tendon 3 mg Dexasone Kenalog 5 mg Xylocaine sheath applied fascial brace explaining her how to use it to elevate the arch and take pressure off the posterior tib.  Placed on oral antidiclofenac reappoint to recheck in 2 weeks  X-ray indicates  moderate depression of the arch did not indicate other pathology current

## 2021-09-28 NOTE — Telephone Encounter (Signed)
Pt called asking about the next appt. In the note it says 2 wks and in the avs is states 3 wks. Also in your note it states she is to be placed on oral antidiclofenac but there was no rx sent in. Does she need to take this?

## 2021-09-29 NOTE — Telephone Encounter (Signed)
Notified pt per Dr Beverlee Nims message.

## 2021-09-29 NOTE — Telephone Encounter (Signed)
Her appointment time is fine. We will go without the medication if she is doing well

## 2021-10-02 DIAGNOSIS — M25511 Pain in right shoulder: Secondary | ICD-10-CM | POA: Diagnosis not present

## 2021-10-19 ENCOUNTER — Ambulatory Visit (INDEPENDENT_AMBULATORY_CARE_PROVIDER_SITE_OTHER): Payer: BC Managed Care – PPO | Admitting: Podiatry

## 2021-10-19 ENCOUNTER — Encounter: Payer: Self-pay | Admitting: Podiatry

## 2021-10-19 DIAGNOSIS — M76821 Posterior tibial tendinitis, right leg: Secondary | ICD-10-CM | POA: Diagnosis not present

## 2021-10-19 DIAGNOSIS — M76822 Posterior tibial tendinitis, left leg: Secondary | ICD-10-CM | POA: Diagnosis not present

## 2021-10-19 NOTE — Progress Notes (Signed)
Subjective:   Patient ID: Rebecca Shea, female   DOB: 51 y.o.   MRN: [id number]   HPI Patient presents stating it is improved but still sore and I have been wearing my boot as best I can and I feel like I am getting need more support   ROS      Objective:  Physical Exam  Neurovascular status intact with discomfort still in the posterior tibial tendon near its insertion navicular left localized to this area no proximal edema erythema noted     Assessment:  Posterior tibial tendinitis improved left still present     Plan:  Advised this patient on long-term orthotics and casted for functional orthotics and discussed anti-inflammatories to be continued boot usage and reviewed again posterior tibial tendinitis and different treatment considerations long-term.  Patient will be seen back when orthotics return we will continue boot and brace usage till that time and also utilize ice as needed along with reduced activity

## 2021-10-30 DIAGNOSIS — D509 Iron deficiency anemia, unspecified: Secondary | ICD-10-CM | POA: Diagnosis not present

## 2021-10-30 DIAGNOSIS — N939 Abnormal uterine and vaginal bleeding, unspecified: Secondary | ICD-10-CM | POA: Diagnosis not present

## 2021-11-02 DIAGNOSIS — N841 Polyp of cervix uteri: Secondary | ICD-10-CM | POA: Diagnosis not present

## 2021-11-02 DIAGNOSIS — D649 Anemia, unspecified: Secondary | ICD-10-CM | POA: Diagnosis not present

## 2021-11-02 DIAGNOSIS — Z30430 Encounter for insertion of intrauterine contraceptive device: Secondary | ICD-10-CM | POA: Diagnosis not present

## 2021-11-27 ENCOUNTER — Telehealth: Payer: Self-pay | Admitting: Podiatry

## 2021-11-27 NOTE — Telephone Encounter (Signed)
No answer left voice mail

## 2021-11-30 ENCOUNTER — Ambulatory Visit (INDEPENDENT_AMBULATORY_CARE_PROVIDER_SITE_OTHER): Payer: BC Managed Care – PPO

## 2021-11-30 DIAGNOSIS — M76822 Posterior tibial tendinitis, left leg: Secondary | ICD-10-CM

## 2021-11-30 NOTE — Progress Notes (Signed)
Patient presents today to pick up custom molded foot orthotics, diagnosed with tibial tendinitis by Dr. Charlsie Merles.   Orthotics were dispensed and fit was satisfactory. Reviewed instructions for break-in and wear. Written instructions given to patient.  Patient will follow up as needed.   [facility] - order # [id number]

## 2022-01-15 DIAGNOSIS — F411 Generalized anxiety disorder: Secondary | ICD-10-CM | POA: Diagnosis not present

## 2022-01-15 DIAGNOSIS — Z6841 Body Mass Index (BMI) 40.0 and over, adult: Secondary | ICD-10-CM | POA: Diagnosis not present

## 2022-01-15 DIAGNOSIS — R0602 Shortness of breath: Secondary | ICD-10-CM | POA: Diagnosis not present

## 2022-01-15 DIAGNOSIS — Z01419 Encounter for gynecological examination (general) (routine) without abnormal findings: Secondary | ICD-10-CM | POA: Diagnosis not present

## 2022-01-15 DIAGNOSIS — R030 Elevated blood-pressure reading, without diagnosis of hypertension: Secondary | ICD-10-CM | POA: Diagnosis not present

## 2022-01-15 DIAGNOSIS — Z862 Personal history of diseases of the blood and blood-forming organs and certain disorders involving the immune mechanism: Secondary | ICD-10-CM | POA: Diagnosis not present

## 2022-01-15 DIAGNOSIS — R600 Localized edema: Secondary | ICD-10-CM | POA: Diagnosis not present

## 2022-01-15 DIAGNOSIS — Z0000 Encounter for general adult medical examination without abnormal findings: Secondary | ICD-10-CM | POA: Diagnosis not present

## 2022-01-15 DIAGNOSIS — Z1231 Encounter for screening mammogram for malignant neoplasm of breast: Secondary | ICD-10-CM | POA: Diagnosis not present

## 2022-01-15 DIAGNOSIS — Z13220 Encounter for screening for lipoid disorders: Secondary | ICD-10-CM | POA: Diagnosis not present

## 2022-01-15 DIAGNOSIS — Z23 Encounter for immunization: Secondary | ICD-10-CM | POA: Diagnosis not present

## 2022-01-15 DIAGNOSIS — R079 Chest pain, unspecified: Secondary | ICD-10-CM | POA: Diagnosis not present

## 2022-01-15 DIAGNOSIS — K219 Gastro-esophageal reflux disease without esophagitis: Secondary | ICD-10-CM | POA: Diagnosis not present

## 2022-03-09 DIAGNOSIS — M76822 Posterior tibial tendinitis, left leg: Secondary | ICD-10-CM | POA: Diagnosis not present

## 2022-03-09 DIAGNOSIS — M722 Plantar fascial fibromatosis: Secondary | ICD-10-CM | POA: Diagnosis not present

## 2022-04-03 DIAGNOSIS — H5213 Myopia, bilateral: Secondary | ICD-10-CM | POA: Diagnosis not present

## 2022-04-03 DIAGNOSIS — H52223 Regular astigmatism, bilateral: Secondary | ICD-10-CM | POA: Diagnosis not present

## 2022-04-03 DIAGNOSIS — H524 Presbyopia: Secondary | ICD-10-CM | POA: Diagnosis not present

## 2023-01-30 DIAGNOSIS — Z1231 Encounter for screening mammogram for malignant neoplasm of breast: Secondary | ICD-10-CM | POA: Diagnosis not present

## 2023-01-30 DIAGNOSIS — Z6841 Body Mass Index (BMI) 40.0 and over, adult: Secondary | ICD-10-CM | POA: Diagnosis not present

## 2023-01-30 DIAGNOSIS — Z01419 Encounter for gynecological examination (general) (routine) without abnormal findings: Secondary | ICD-10-CM | POA: Diagnosis not present

## 2023-05-01 ENCOUNTER — Other Ambulatory Visit (HOSPITAL_BASED_OUTPATIENT_CLINIC_OR_DEPARTMENT_OTHER): Payer: Self-pay

## 2023-05-01 DIAGNOSIS — Z713 Dietary counseling and surveillance: Secondary | ICD-10-CM | POA: Diagnosis not present

## 2023-05-01 DIAGNOSIS — Z6841 Body Mass Index (BMI) 40.0 and over, adult: Secondary | ICD-10-CM | POA: Diagnosis not present

## 2023-05-01 MED ORDER — WEGOVY 0.25 MG/0.5ML ~~LOC~~ SOAJ
0.2500 mg | SUBCUTANEOUS | 1 refills | Status: DC
  Filled 2023-05-01: qty 2, 28d supply, fill #0

## 2023-05-01 MED ORDER — SEMAGLUTIDE-WEIGHT MANAGEMENT 0.25 MG/0.5ML ~~LOC~~ SOAJ
0.2500 mg | SUBCUTANEOUS | 0 refills | Status: AC
  Filled 2023-05-01 – 2023-08-19 (×2): qty 2, 28d supply, fill #0

## 2023-05-06 ENCOUNTER — Other Ambulatory Visit (HOSPITAL_BASED_OUTPATIENT_CLINIC_OR_DEPARTMENT_OTHER): Payer: Self-pay

## 2023-05-06 ENCOUNTER — Encounter (HOSPITAL_BASED_OUTPATIENT_CLINIC_OR_DEPARTMENT_OTHER): Payer: Self-pay

## 2023-05-07 ENCOUNTER — Other Ambulatory Visit (HOSPITAL_BASED_OUTPATIENT_CLINIC_OR_DEPARTMENT_OTHER): Payer: Self-pay

## 2023-05-08 ENCOUNTER — Other Ambulatory Visit (HOSPITAL_BASED_OUTPATIENT_CLINIC_OR_DEPARTMENT_OTHER): Payer: Self-pay

## 2023-05-13 ENCOUNTER — Other Ambulatory Visit (HOSPITAL_BASED_OUTPATIENT_CLINIC_OR_DEPARTMENT_OTHER): Payer: Self-pay

## 2023-06-17 DIAGNOSIS — H524 Presbyopia: Secondary | ICD-10-CM | POA: Diagnosis not present

## 2023-06-17 DIAGNOSIS — H5213 Myopia, bilateral: Secondary | ICD-10-CM | POA: Diagnosis not present

## 2023-06-17 DIAGNOSIS — H52223 Regular astigmatism, bilateral: Secondary | ICD-10-CM | POA: Diagnosis not present

## 2023-06-19 ENCOUNTER — Other Ambulatory Visit (HOSPITAL_BASED_OUTPATIENT_CLINIC_OR_DEPARTMENT_OTHER): Payer: Self-pay

## 2023-06-19 MED ORDER — WEGOVY 0.5 MG/0.5ML ~~LOC~~ SOAJ
0.5000 mg | SUBCUTANEOUS | 1 refills | Status: DC
  Filled 2023-06-19: qty 2, 28d supply, fill #0

## 2023-06-22 ENCOUNTER — Other Ambulatory Visit (HOSPITAL_BASED_OUTPATIENT_CLINIC_OR_DEPARTMENT_OTHER): Payer: Self-pay

## 2023-07-17 ENCOUNTER — Other Ambulatory Visit (HOSPITAL_BASED_OUTPATIENT_CLINIC_OR_DEPARTMENT_OTHER): Payer: Self-pay

## 2023-07-17 DIAGNOSIS — Z713 Dietary counseling and surveillance: Secondary | ICD-10-CM | POA: Diagnosis not present

## 2023-07-17 DIAGNOSIS — Z6839 Body mass index (BMI) 39.0-39.9, adult: Secondary | ICD-10-CM | POA: Diagnosis not present

## 2023-07-17 DIAGNOSIS — N951 Menopausal and female climacteric states: Secondary | ICD-10-CM | POA: Diagnosis not present

## 2023-07-17 DIAGNOSIS — E8941 Symptomatic postprocedural ovarian failure: Secondary | ICD-10-CM | POA: Diagnosis not present

## 2023-07-17 MED ORDER — WEGOVY 1 MG/0.5ML ~~LOC~~ SOAJ
1.0000 mg | SUBCUTANEOUS | 2 refills | Status: DC
  Filled 2023-07-17: qty 2, 28d supply, fill #0

## 2023-07-18 ENCOUNTER — Other Ambulatory Visit (HOSPITAL_BASED_OUTPATIENT_CLINIC_OR_DEPARTMENT_OTHER): Payer: Self-pay

## 2023-07-18 MED ORDER — ESTRADIOL 0.05 MG/24HR TD PTTW
1.0000 | MEDICATED_PATCH | TRANSDERMAL | 3 refills | Status: AC
  Filled 2023-07-18: qty 24, 84d supply, fill #0
  Filled 2023-10-06: qty 24, 84d supply, fill #1
  Filled 2023-12-30: qty 24, 84d supply, fill #2
  Filled 2024-03-25: qty 24, 84d supply, fill #3

## 2023-07-19 ENCOUNTER — Other Ambulatory Visit (HOSPITAL_BASED_OUTPATIENT_CLINIC_OR_DEPARTMENT_OTHER): Payer: Self-pay

## 2023-08-19 ENCOUNTER — Other Ambulatory Visit (HOSPITAL_BASED_OUTPATIENT_CLINIC_OR_DEPARTMENT_OTHER): Payer: Self-pay

## 2023-08-21 ENCOUNTER — Other Ambulatory Visit (HOSPITAL_BASED_OUTPATIENT_CLINIC_OR_DEPARTMENT_OTHER): Payer: Self-pay

## 2023-08-21 MED ORDER — WEGOVY 1.7 MG/0.75ML ~~LOC~~ SOAJ
1.7000 mg | SUBCUTANEOUS | 0 refills | Status: DC
  Filled 2023-08-21 – 2023-08-22 (×3): qty 3, 28d supply, fill #0

## 2023-08-22 ENCOUNTER — Other Ambulatory Visit (HOSPITAL_BASED_OUTPATIENT_CLINIC_OR_DEPARTMENT_OTHER): Payer: Self-pay

## 2023-09-11 ENCOUNTER — Other Ambulatory Visit (HOSPITAL_BASED_OUTPATIENT_CLINIC_OR_DEPARTMENT_OTHER): Payer: Self-pay

## 2023-09-11 MED ORDER — WEGOVY 2.4 MG/0.75ML ~~LOC~~ SOAJ
2.4000 mg | SUBCUTANEOUS | 0 refills | Status: DC
  Filled 2023-09-11: qty 3, 28d supply, fill #0
  Filled 2023-09-11: qty 3, fill #0
  Filled 2023-09-12: qty 3, 28d supply, fill #0

## 2023-09-12 ENCOUNTER — Encounter (HOSPITAL_BASED_OUTPATIENT_CLINIC_OR_DEPARTMENT_OTHER): Payer: Self-pay

## 2023-09-12 ENCOUNTER — Other Ambulatory Visit (HOSPITAL_BASED_OUTPATIENT_CLINIC_OR_DEPARTMENT_OTHER): Payer: Self-pay

## 2023-09-13 ENCOUNTER — Other Ambulatory Visit (HOSPITAL_BASED_OUTPATIENT_CLINIC_OR_DEPARTMENT_OTHER): Payer: Self-pay

## 2023-09-17 ENCOUNTER — Other Ambulatory Visit (HOSPITAL_BASED_OUTPATIENT_CLINIC_OR_DEPARTMENT_OTHER): Payer: Self-pay

## 2023-09-17 DIAGNOSIS — H43313 Vitreous membranes and strands, bilateral: Secondary | ICD-10-CM | POA: Diagnosis not present

## 2023-09-17 DIAGNOSIS — H25013 Cortical age-related cataract, bilateral: Secondary | ICD-10-CM | POA: Diagnosis not present

## 2023-09-17 DIAGNOSIS — H2513 Age-related nuclear cataract, bilateral: Secondary | ICD-10-CM | POA: Diagnosis not present

## 2023-09-18 ENCOUNTER — Other Ambulatory Visit (HOSPITAL_BASED_OUTPATIENT_CLINIC_OR_DEPARTMENT_OTHER): Payer: Self-pay

## 2023-09-25 DIAGNOSIS — Z6839 Body mass index (BMI) 39.0-39.9, adult: Secondary | ICD-10-CM | POA: Diagnosis not present

## 2023-09-25 DIAGNOSIS — Z713 Dietary counseling and surveillance: Secondary | ICD-10-CM | POA: Diagnosis not present

## 2023-10-06 ENCOUNTER — Other Ambulatory Visit (HOSPITAL_BASED_OUTPATIENT_CLINIC_OR_DEPARTMENT_OTHER): Payer: Self-pay

## 2023-10-07 ENCOUNTER — Other Ambulatory Visit: Payer: Self-pay

## 2023-10-07 ENCOUNTER — Other Ambulatory Visit (HOSPITAL_BASED_OUTPATIENT_CLINIC_OR_DEPARTMENT_OTHER): Payer: Self-pay

## 2023-10-07 MED ORDER — WEGOVY 2.4 MG/0.75ML ~~LOC~~ SOAJ
2.4000 mg | SUBCUTANEOUS | 0 refills | Status: DC
  Filled 2023-10-07 – 2023-10-09 (×2): qty 3, 28d supply, fill #0

## 2023-10-09 ENCOUNTER — Other Ambulatory Visit: Payer: Self-pay

## 2023-10-09 ENCOUNTER — Other Ambulatory Visit (HOSPITAL_BASED_OUTPATIENT_CLINIC_OR_DEPARTMENT_OTHER): Payer: Self-pay

## 2023-10-29 DIAGNOSIS — H43313 Vitreous membranes and strands, bilateral: Secondary | ICD-10-CM | POA: Diagnosis not present

## 2023-10-29 DIAGNOSIS — H25013 Cortical age-related cataract, bilateral: Secondary | ICD-10-CM | POA: Diagnosis not present

## 2023-10-29 DIAGNOSIS — H2513 Age-related nuclear cataract, bilateral: Secondary | ICD-10-CM | POA: Diagnosis not present

## 2023-10-29 DIAGNOSIS — H04123 Dry eye syndrome of bilateral lacrimal glands: Secondary | ICD-10-CM | POA: Diagnosis not present

## 2023-11-13 ENCOUNTER — Other Ambulatory Visit (HOSPITAL_BASED_OUTPATIENT_CLINIC_OR_DEPARTMENT_OTHER): Payer: Self-pay

## 2023-11-13 MED ORDER — WEGOVY 2.4 MG/0.75ML ~~LOC~~ SOAJ
2.4000 mg | SUBCUTANEOUS | 0 refills | Status: DC
  Filled 2023-11-13: qty 3, 28d supply, fill #0

## 2023-11-20 ENCOUNTER — Other Ambulatory Visit (HOSPITAL_BASED_OUTPATIENT_CLINIC_OR_DEPARTMENT_OTHER): Payer: Self-pay

## 2023-11-20 DIAGNOSIS — Z6838 Body mass index (BMI) 38.0-38.9, adult: Secondary | ICD-10-CM | POA: Diagnosis not present

## 2023-11-20 DIAGNOSIS — Z713 Dietary counseling and surveillance: Secondary | ICD-10-CM | POA: Diagnosis not present

## 2023-11-20 MED ORDER — WEGOVY 2.4 MG/0.75ML ~~LOC~~ SOAJ
2.4000 mg | SUBCUTANEOUS | 1 refills | Status: DC
  Filled 2023-12-09: qty 3, 28d supply, fill #0
  Filled 2024-01-06: qty 3, 28d supply, fill #1

## 2023-12-09 ENCOUNTER — Other Ambulatory Visit (HOSPITAL_BASED_OUTPATIENT_CLINIC_OR_DEPARTMENT_OTHER): Payer: Self-pay

## 2024-01-13 ENCOUNTER — Other Ambulatory Visit (HOSPITAL_BASED_OUTPATIENT_CLINIC_OR_DEPARTMENT_OTHER): Payer: Self-pay

## 2024-01-13 DIAGNOSIS — Z713 Dietary counseling and surveillance: Secondary | ICD-10-CM | POA: Diagnosis not present

## 2024-01-13 DIAGNOSIS — Z6837 Body mass index (BMI) 37.0-37.9, adult: Secondary | ICD-10-CM | POA: Diagnosis not present

## 2024-01-13 MED ORDER — WEGOVY 2.4 MG/0.75ML ~~LOC~~ SOAJ
2.4000 mg | SUBCUTANEOUS | 1 refills | Status: DC
  Filled 2024-02-03: qty 3, 28d supply, fill #0
  Filled 2024-03-02: qty 3, 28d supply, fill #1

## 2024-02-03 ENCOUNTER — Other Ambulatory Visit (HOSPITAL_BASED_OUTPATIENT_CLINIC_OR_DEPARTMENT_OTHER): Payer: Self-pay

## 2024-02-13 DIAGNOSIS — Z01419 Encounter for gynecological examination (general) (routine) without abnormal findings: Secondary | ICD-10-CM | POA: Diagnosis not present

## 2024-02-13 DIAGNOSIS — Z1231 Encounter for screening mammogram for malignant neoplasm of breast: Secondary | ICD-10-CM | POA: Diagnosis not present

## 2024-02-13 DIAGNOSIS — Z6836 Body mass index (BMI) 36.0-36.9, adult: Secondary | ICD-10-CM | POA: Diagnosis not present

## 2024-03-09 ENCOUNTER — Other Ambulatory Visit (HOSPITAL_BASED_OUTPATIENT_CLINIC_OR_DEPARTMENT_OTHER): Payer: Self-pay

## 2024-03-09 DIAGNOSIS — Z6837 Body mass index (BMI) 37.0-37.9, adult: Secondary | ICD-10-CM | POA: Diagnosis not present

## 2024-03-09 DIAGNOSIS — Z713 Dietary counseling and surveillance: Secondary | ICD-10-CM | POA: Diagnosis not present

## 2024-03-09 MED ORDER — WEGOVY 2.4 MG/0.75ML ~~LOC~~ SOAJ
2.4000 mg | SUBCUTANEOUS | 1 refills | Status: DC
  Filled 2024-03-09 – 2024-03-25 (×3): qty 3, 28d supply, fill #0
  Filled 2024-04-29: qty 3, 28d supply, fill #1

## 2024-03-12 ENCOUNTER — Other Ambulatory Visit (HOSPITAL_BASED_OUTPATIENT_CLINIC_OR_DEPARTMENT_OTHER): Payer: Self-pay

## 2024-03-12 DIAGNOSIS — N939 Abnormal uterine and vaginal bleeding, unspecified: Secondary | ICD-10-CM | POA: Diagnosis not present

## 2024-03-12 DIAGNOSIS — Z79890 Hormone replacement therapy (postmenopausal): Secondary | ICD-10-CM | POA: Diagnosis not present

## 2024-03-12 DIAGNOSIS — N926 Irregular menstruation, unspecified: Secondary | ICD-10-CM | POA: Diagnosis not present

## 2024-03-12 MED ORDER — PROGESTERONE MICRONIZED 100 MG PO CAPS
100.0000 mg | ORAL_CAPSULE | Freq: Every day | ORAL | 6 refills | Status: AC
  Filled 2024-03-12: qty 30, 30d supply, fill #0

## 2024-03-23 ENCOUNTER — Other Ambulatory Visit (HOSPITAL_BASED_OUTPATIENT_CLINIC_OR_DEPARTMENT_OTHER): Payer: Self-pay

## 2024-03-25 ENCOUNTER — Other Ambulatory Visit (HOSPITAL_BASED_OUTPATIENT_CLINIC_OR_DEPARTMENT_OTHER): Payer: Self-pay

## 2024-03-25 ENCOUNTER — Other Ambulatory Visit: Payer: Self-pay

## 2024-05-04 ENCOUNTER — Other Ambulatory Visit (HOSPITAL_BASED_OUTPATIENT_CLINIC_OR_DEPARTMENT_OTHER): Payer: Self-pay

## 2024-05-20 ENCOUNTER — Other Ambulatory Visit (HOSPITAL_BASED_OUTPATIENT_CLINIC_OR_DEPARTMENT_OTHER): Payer: Self-pay

## 2024-05-20 MED ORDER — WEGOVY 2.4 MG/0.75ML ~~LOC~~ SOAJ
2.4000 mg | SUBCUTANEOUS | 2 refills | Status: AC
  Filled 2024-05-20: qty 3, 28d supply, fill #0
